# Patient Record
Sex: Female | Born: 1975 | Race: White | Hispanic: No | Marital: Married | Smoking: Never smoker
Health system: Southern US, Community
[De-identification: ages and names within clinical notes are randomized; demographics above are authoritative.]

## PROBLEM LIST (undated history)

## (undated) DIAGNOSIS — R112 Nausea with vomiting, unspecified: Secondary | ICD-10-CM

## (undated) DIAGNOSIS — F419 Anxiety disorder, unspecified: Secondary | ICD-10-CM

## (undated) HISTORY — PX: RHINOPLASTY: SUR1284

## (undated) HISTORY — PX: SINOSCOPY: SHX187

## (undated) HISTORY — PX: TUBAL LIGATION: SHX77

---

## 2004-12-15 ENCOUNTER — Encounter: Admission: RE | Admit: 2004-12-15 | Discharge: 2004-12-15 | Payer: Self-pay

## 2005-03-01 ENCOUNTER — Encounter: Admission: RE | Admit: 2005-03-01 | Discharge: 2005-03-01 | Payer: Self-pay | Admitting: Family Medicine

## 2007-07-24 ENCOUNTER — Encounter: Admission: RE | Admit: 2007-07-24 | Discharge: 2007-07-24 | Payer: Self-pay

## 2008-11-08 ENCOUNTER — Encounter: Admission: RE | Admit: 2008-11-08 | Discharge: 2008-11-08 | Payer: Self-pay | Admitting: Obstetrics & Gynecology

## 2009-07-25 ENCOUNTER — Encounter: Admission: RE | Admit: 2009-07-25 | Discharge: 2009-07-25 | Payer: Self-pay

## 2009-10-07 ENCOUNTER — Encounter: Admission: RE | Admit: 2009-10-07 | Discharge: 2009-10-07 | Payer: Self-pay | Admitting: Neurology

## 2009-12-14 ENCOUNTER — Encounter: Admission: RE | Admit: 2009-12-14 | Discharge: 2009-12-14 | Payer: Self-pay | Admitting: Obstetrics and Gynecology

## 2010-03-14 ENCOUNTER — Encounter: Admission: RE | Admit: 2010-03-14 | Discharge: 2010-03-14 | Payer: Self-pay | Admitting: Otolaryngology

## 2010-08-31 ENCOUNTER — Encounter: Admission: RE | Admit: 2010-08-31 | Discharge: 2010-08-31 | Payer: Self-pay | Admitting: Family Medicine

## 2010-10-16 ENCOUNTER — Encounter
Admission: RE | Admit: 2010-10-16 | Discharge: 2010-10-16 | Payer: Self-pay | Source: Home / Self Care | Attending: Otolaryngology | Admitting: Otolaryngology

## 2010-11-30 ENCOUNTER — Other Ambulatory Visit: Payer: Self-pay | Admitting: Obstetrics and Gynecology

## 2010-11-30 DIAGNOSIS — N6310 Unspecified lump in the right breast, unspecified quadrant: Secondary | ICD-10-CM

## 2010-12-12 ENCOUNTER — Ambulatory Visit
Admission: RE | Admit: 2010-12-12 | Discharge: 2010-12-12 | Disposition: A | Discharge status: Home / Self Care | Payer: No Typology Code available for payment source | Source: Ambulatory Visit | Attending: Obstetrics and Gynecology | Admitting: Obstetrics and Gynecology

## 2010-12-12 ENCOUNTER — Other Ambulatory Visit: Payer: Self-pay | Admitting: Obstetrics and Gynecology

## 2010-12-12 DIAGNOSIS — N6310 Unspecified lump in the right breast, unspecified quadrant: Secondary | ICD-10-CM

## 2012-04-09 ENCOUNTER — Ambulatory Visit
Admission: RE | Admit: 2012-04-09 | Discharge: 2012-04-09 | Disposition: A | Discharge status: Home / Self Care | Payer: PRIVATE HEALTH INSURANCE | Source: Ambulatory Visit | Attending: Otolaryngology | Admitting: Otolaryngology

## 2012-04-09 ENCOUNTER — Other Ambulatory Visit: Payer: Self-pay | Admitting: Otolaryngology

## 2012-04-09 DIAGNOSIS — R1310 Dysphagia, unspecified: Secondary | ICD-10-CM

## 2012-06-03 ENCOUNTER — Other Ambulatory Visit: Payer: Self-pay | Admitting: Otolaryngology

## 2012-06-03 ENCOUNTER — Ambulatory Visit
Admission: RE | Admit: 2012-06-03 | Discharge: 2012-06-03 | Disposition: A | Discharge status: Home / Self Care | Payer: PRIVATE HEALTH INSURANCE | Source: Ambulatory Visit | Attending: Otolaryngology | Admitting: Otolaryngology

## 2012-06-03 DIAGNOSIS — J329 Chronic sinusitis, unspecified: Secondary | ICD-10-CM

## 2013-08-18 ENCOUNTER — Other Ambulatory Visit: Payer: Self-pay | Admitting: Family Medicine

## 2013-08-18 ENCOUNTER — Ambulatory Visit
Admission: RE | Admit: 2013-08-18 | Discharge: 2013-08-18 | Disposition: A | Discharge status: Home / Self Care | Payer: PRIVATE HEALTH INSURANCE | Source: Ambulatory Visit | Attending: Family Medicine | Admitting: Family Medicine

## 2013-08-18 DIAGNOSIS — G8929 Other chronic pain: Secondary | ICD-10-CM

## 2013-08-18 DIAGNOSIS — M542 Cervicalgia: Secondary | ICD-10-CM

## 2013-08-28 ENCOUNTER — Other Ambulatory Visit: Payer: Self-pay | Admitting: Family Medicine

## 2013-08-28 DIAGNOSIS — M542 Cervicalgia: Secondary | ICD-10-CM

## 2013-09-09 ENCOUNTER — Ambulatory Visit
Admission: RE | Admit: 2013-09-09 | Discharge: 2013-09-09 | Disposition: A | Discharge status: Home / Self Care | Payer: PRIVATE HEALTH INSURANCE | Source: Ambulatory Visit | Attending: Family Medicine | Admitting: Family Medicine

## 2013-09-09 DIAGNOSIS — M542 Cervicalgia: Secondary | ICD-10-CM

## 2014-01-18 ENCOUNTER — Other Ambulatory Visit: Payer: Self-pay | Admitting: Family Medicine

## 2014-01-18 ENCOUNTER — Ambulatory Visit
Admission: RE | Admit: 2014-01-18 | Discharge: 2014-01-18 | Disposition: A | Discharge status: Home / Self Care | Payer: PRIVATE HEALTH INSURANCE | Source: Ambulatory Visit | Attending: Family Medicine | Admitting: Family Medicine

## 2014-01-18 DIAGNOSIS — M25562 Pain in left knee: Principal | ICD-10-CM

## 2014-01-18 DIAGNOSIS — M25561 Pain in right knee: Secondary | ICD-10-CM

## 2014-03-15 ENCOUNTER — Other Ambulatory Visit: Payer: Self-pay | Admitting: Otolaryngology

## 2014-03-15 ENCOUNTER — Ambulatory Visit
Admission: RE | Admit: 2014-03-15 | Discharge: 2014-03-15 | Disposition: A | Discharge status: Home / Self Care | Payer: PRIVATE HEALTH INSURANCE | Source: Ambulatory Visit | Attending: Otolaryngology | Admitting: Otolaryngology

## 2014-03-15 DIAGNOSIS — J320 Chronic maxillary sinusitis: Secondary | ICD-10-CM

## 2014-03-30 ENCOUNTER — Other Ambulatory Visit: Payer: Self-pay | Admitting: Otolaryngology

## 2014-03-30 ENCOUNTER — Ambulatory Visit
Admission: RE | Admit: 2014-03-30 | Discharge: 2014-03-30 | Disposition: A | Discharge status: Home / Self Care | Payer: PRIVATE HEALTH INSURANCE | Source: Ambulatory Visit | Attending: Otolaryngology | Admitting: Otolaryngology

## 2014-03-30 DIAGNOSIS — R05 Cough: Secondary | ICD-10-CM

## 2014-03-30 DIAGNOSIS — R059 Cough, unspecified: Secondary | ICD-10-CM

## 2014-04-05 ENCOUNTER — Ambulatory Visit
Admission: RE | Admit: 2014-04-05 | Discharge: 2014-04-05 | Disposition: A | Discharge status: Home / Self Care | Payer: PRIVATE HEALTH INSURANCE | Source: Ambulatory Visit | Attending: Otolaryngology | Admitting: Otolaryngology

## 2014-04-05 ENCOUNTER — Other Ambulatory Visit: Payer: Self-pay | Admitting: Otolaryngology

## 2014-04-05 DIAGNOSIS — J320 Chronic maxillary sinusitis: Secondary | ICD-10-CM

## 2015-07-27 ENCOUNTER — Ambulatory Visit (INDEPENDENT_AMBULATORY_CARE_PROVIDER_SITE_OTHER): Payer: PRIVATE HEALTH INSURANCE | Admitting: *Deleted

## 2015-07-27 DIAGNOSIS — J309 Allergic rhinitis, unspecified: Secondary | ICD-10-CM

## 2015-08-04 ENCOUNTER — Ambulatory Visit (INDEPENDENT_AMBULATORY_CARE_PROVIDER_SITE_OTHER): Payer: PRIVATE HEALTH INSURANCE | Admitting: Neurology

## 2015-08-04 DIAGNOSIS — J309 Allergic rhinitis, unspecified: Secondary | ICD-10-CM

## 2015-08-09 ENCOUNTER — Encounter: Payer: Self-pay | Admitting: Allergy and Immunology

## 2015-08-09 ENCOUNTER — Ambulatory Visit (INDEPENDENT_AMBULATORY_CARE_PROVIDER_SITE_OTHER): Payer: PRIVATE HEALTH INSURANCE | Admitting: Allergy and Immunology

## 2015-08-09 VITALS — BP 100/70 | HR 60 | Temp 98.0°F | Resp 20

## 2015-08-09 DIAGNOSIS — J3089 Other allergic rhinitis: Secondary | ICD-10-CM | POA: Diagnosis not present

## 2015-08-09 NOTE — Patient Instructions (Signed)
Other allergic rhinitis  Hold immunotherapy dose at 0.20 mL of the Blue vial for the pollens and dust mite and mold immunotherapy dose at her 0.15 mL of the Gold vial for ragweed, cat, mold, and cockroach antigen.   If she has no large local reactions for a prolonged period of time, we will attempt to slowly build up to higher dosages.  When immunotherapy vials are almost empty, we will re-mix into 3 separate vials, rather than 2 vials in an attempt to reduce large local reactions and possibly move to a higher dosages. Vial #1 will contain the following antigens: Grass, cat, and dust mite. Vial #2 will contain the following antigens: Weed pollen, ragweed pollen, and tree pollen. Vial #3 will contain the following antigens: Mold and cockroach.  Continue cetirizine, azelastine, and/or Pataday as needed.   Return in about 4 months (around 12/10/2015), or if symptoms worsen or fail to improve.

## 2015-08-09 NOTE — Progress Notes (Signed)
  History of present illness: HPI Comments: This 39 year old female with allergic rhinoconjunctivitis on immunotherapy presents today for follow up.  She reports that she has noticed improvement in her nasal/sinus symptoms while on immunotherapy buildup.  However, she has been unable to reach maintenance dose due to large local reactions.    Assessment and plan: Other allergic rhinitis  Hold immunotherapy dose at 0.20 mL of the Blue vial for the pollens and dust mite and mold immunotherapy dose at her 0.15 mL of the Gold vial for ragweed, cat, mold, and cockroach antigen.   If she has no large local reactions for a prolonged period of time, we will attempt to slowly build up to higher dosages.  When immunotherapy vials are almost empty, we will re-mix into 3 separate vials, rather than 2 vials in an attempt to reduce large local reactions and possibly move to a higher dosages. Vial #1 will contain the following antigens: Grass, cat, and dust mite. Vial #2 will contain the following antigens: Weed pollen, ragweed pollen, and tree pollen. Vial #3 will contain the following antigens: Mold and cockroach.  Continue cetirizine, azelastine, and/or Pataday as needed.   Diagnositics: none performed     Physical examination: Blood pressure 100/70, pulse 60, temperature 98 F (36.7 C), temperature source Oral, resp. rate 20.  General: Alert, interactive, in no acute distress. HEENT: TMs pearly gray, turbinates minimally edematous without discharge, post-pharynx non erythematous. Neck: Supple without lymphadenopathy. Lungs: Clear to auscultation without wheezing, rhonchi or rales. CV: Normal S1, S2 without murmurs. Skin: Warm and dry, without lesions or rashes.  The following portions of the patient's history were reviewed and updated as appropriate: allergies, current medications, past family history, past medical history, past social history, past surgical history and problem  list.  Outpatient medications:   Medication List       This list is accurate as of: 08/09/15  5:01 PM.  Always use your most recent med list.               azelastine 0.1 % nasal spray  Commonly known as:  ASTELIN  Place 1 spray into both nostrils 2 (two) times daily. Use in each nostril as directed     cetirizine 10 MG tablet  Commonly known as:  ZYRTEC  Take 10 mg by mouth daily.     EPINEPHrine 0.3 mg/0.3 mL Soaj injection  Commonly known as:  EPI-PEN  Inject 0.3 mg into the muscle once as needed (for a severe allergic reaction).     multivitamin tablet  Take 1 tablet by mouth daily.     NORETHIN-ETH ESTRADIOL-FE PO  Take by mouth.     norethindrone 0.35 MG tablet  Commonly known as:  MICRONOR,CAMILA,ERRIN  Take 1 tablet by mouth daily.     PATADAY 0.2 % Soln  Generic drug:  Olopatadine HCl  Apply to eye once.     sertraline 50 MG tablet  Commonly known as:  ZOLOFT  Take 50 mg by mouth daily.        Known medication allergies: Allergies  Allergen Reactions  . Vancomycin Hives  . Latex Itching  . Monistat [Miconazole] Swelling  . Terazol [Terconazole] Swelling    I appreciate the opportunity to take part in this Dailah's care. Please do not hesitate to contact me with questions.  Sincerely,   R. Jorene Guest, MD

## 2015-08-09 NOTE — Assessment & Plan Note (Addendum)
   Hold immunotherapy dose at 0.20 mL of the Blue vial for the pollens and dust mite and mold immunotherapy dose at her 0.15 mL of the Gold vial for ragweed, cat, mold, and cockroach antigen.   If she has no large local reactions for a prolonged period of time, we will attempt to slowly build up to higher dosages.  When immunotherapy vials are almost empty, we will re-mix into 3 separate vials, rather than 2 vials in an attempt to reduce large local reactions and possibly move to a higher dosages. Vial #1 will contain the following antigens: Grass, cat, and dust mite. Vial #2 will contain the following antigens: Weed pollen, ragweed pollen, and tree pollen. Vial #3 will contain the following antigens: Mold and cockroach.  Continue cetirizine, azelastine, and/or Pataday as needed.

## 2015-08-10 ENCOUNTER — Ambulatory Visit: Payer: Self-pay | Admitting: Allergy and Immunology

## 2015-08-11 ENCOUNTER — Ambulatory Visit (INDEPENDENT_AMBULATORY_CARE_PROVIDER_SITE_OTHER): Payer: PRIVATE HEALTH INSURANCE

## 2015-08-11 DIAGNOSIS — J309 Allergic rhinitis, unspecified: Secondary | ICD-10-CM

## 2015-08-18 ENCOUNTER — Ambulatory Visit (INDEPENDENT_AMBULATORY_CARE_PROVIDER_SITE_OTHER): Payer: PRIVATE HEALTH INSURANCE | Admitting: Neurology

## 2015-08-18 DIAGNOSIS — J309 Allergic rhinitis, unspecified: Secondary | ICD-10-CM | POA: Diagnosis not present

## 2015-08-25 ENCOUNTER — Ambulatory Visit (INDEPENDENT_AMBULATORY_CARE_PROVIDER_SITE_OTHER): Payer: PRIVATE HEALTH INSURANCE

## 2015-08-25 DIAGNOSIS — J309 Allergic rhinitis, unspecified: Secondary | ICD-10-CM

## 2015-09-08 ENCOUNTER — Ambulatory Visit (INDEPENDENT_AMBULATORY_CARE_PROVIDER_SITE_OTHER): Payer: PRIVATE HEALTH INSURANCE

## 2015-09-08 DIAGNOSIS — J301 Allergic rhinitis due to pollen: Secondary | ICD-10-CM | POA: Diagnosis not present

## 2015-09-21 ENCOUNTER — Ambulatory Visit (INDEPENDENT_AMBULATORY_CARE_PROVIDER_SITE_OTHER): Payer: PRIVATE HEALTH INSURANCE | Admitting: Neurology

## 2015-09-21 DIAGNOSIS — J309 Allergic rhinitis, unspecified: Secondary | ICD-10-CM | POA: Diagnosis not present

## 2015-10-04 ENCOUNTER — Ambulatory Visit (INDEPENDENT_AMBULATORY_CARE_PROVIDER_SITE_OTHER): Payer: PRIVATE HEALTH INSURANCE

## 2015-10-04 DIAGNOSIS — J309 Allergic rhinitis, unspecified: Secondary | ICD-10-CM

## 2015-10-18 ENCOUNTER — Ambulatory Visit (INDEPENDENT_AMBULATORY_CARE_PROVIDER_SITE_OTHER): Payer: PRIVATE HEALTH INSURANCE

## 2015-10-18 DIAGNOSIS — J309 Allergic rhinitis, unspecified: Secondary | ICD-10-CM

## 2015-11-08 ENCOUNTER — Ambulatory Visit (INDEPENDENT_AMBULATORY_CARE_PROVIDER_SITE_OTHER): Payer: PRIVATE HEALTH INSURANCE | Admitting: *Deleted

## 2015-11-08 DIAGNOSIS — J309 Allergic rhinitis, unspecified: Secondary | ICD-10-CM

## 2015-11-15 ENCOUNTER — Other Ambulatory Visit: Payer: PRIVATE HEALTH INSURANCE

## 2015-11-15 ENCOUNTER — Other Ambulatory Visit: Payer: Self-pay | Admitting: Otolaryngology

## 2015-11-15 DIAGNOSIS — J320 Chronic maxillary sinusitis: Secondary | ICD-10-CM

## 2015-11-17 ENCOUNTER — Ambulatory Visit
Admission: RE | Admit: 2015-11-17 | Discharge: 2015-11-17 | Disposition: A | Discharge status: Home / Self Care | Payer: PRIVATE HEALTH INSURANCE | Source: Ambulatory Visit | Attending: Otolaryngology | Admitting: Otolaryngology

## 2015-11-17 DIAGNOSIS — J320 Chronic maxillary sinusitis: Secondary | ICD-10-CM

## 2015-11-22 ENCOUNTER — Other Ambulatory Visit: Payer: Self-pay

## 2015-11-22 ENCOUNTER — Ambulatory Visit (INDEPENDENT_AMBULATORY_CARE_PROVIDER_SITE_OTHER): Payer: PRIVATE HEALTH INSURANCE

## 2015-11-22 DIAGNOSIS — J309 Allergic rhinitis, unspecified: Secondary | ICD-10-CM | POA: Diagnosis not present

## 2015-12-06 ENCOUNTER — Ambulatory Visit (INDEPENDENT_AMBULATORY_CARE_PROVIDER_SITE_OTHER): Payer: PRIVATE HEALTH INSURANCE

## 2015-12-06 DIAGNOSIS — J309 Allergic rhinitis, unspecified: Secondary | ICD-10-CM | POA: Diagnosis not present

## 2015-12-14 ENCOUNTER — Ambulatory Visit: Payer: PRIVATE HEALTH INSURANCE | Admitting: Allergy and Immunology

## 2015-12-21 ENCOUNTER — Encounter: Payer: Self-pay | Admitting: Allergy and Immunology

## 2015-12-21 ENCOUNTER — Ambulatory Visit (INDEPENDENT_AMBULATORY_CARE_PROVIDER_SITE_OTHER): Payer: PRIVATE HEALTH INSURANCE | Admitting: Allergy and Immunology

## 2015-12-21 ENCOUNTER — Ambulatory Visit (INDEPENDENT_AMBULATORY_CARE_PROVIDER_SITE_OTHER): Payer: PRIVATE HEALTH INSURANCE

## 2015-12-21 VITALS — BP 112/64 | HR 64 | Resp 18

## 2015-12-21 DIAGNOSIS — L500 Allergic urticaria: Secondary | ICD-10-CM | POA: Diagnosis not present

## 2015-12-21 DIAGNOSIS — J3089 Other allergic rhinitis: Secondary | ICD-10-CM | POA: Diagnosis not present

## 2015-12-21 DIAGNOSIS — J309 Allergic rhinitis, unspecified: Secondary | ICD-10-CM

## 2015-12-21 NOTE — Progress Notes (Addendum)
Follow-up Note  RE: MARLON VONRUDEN MRN: [id number] DOB: 25-Jan-1976 Date of Office Visit: 12/21/2015  Primary care provider: Retia Passe, MD Referring provider: Retia Passe, MD  History of present illness: HPI Comments: Breanna Fuller is a 40 y.o. female with allergic rhinitis on immunotherapy who presents today for follow up and a new problem.  Over the past 2 months, Tykera has experienced recurrent episodes of hives. Typical distribution includes the neck, back, arms and legs.  The lesions are described as erythematous, raised, and pruritic.  Individual hives last less than 24 hours without leaving residual pigmentation or bruising. She does not experience concomitant cardiopulmonary or GI symptoms. She has not experienced unexpected weight loss, recurrent fevers or drenching night sweats. No specific medication, food or environmental triggers have been identified. The symptoms seem to","do not seem to correlate with NSAIDs use. Symptoms seem to correlate with emotional stress. She had an upper respiratory tract infection at the time of symptom onset. Katielynn has tried to control symptoms with OTC antihistamines which have offered modest temporary relief of symptoms. She has not been evaluated and treated in the emergency department for these symptoms. Skin biopsy has not been performed. While on aeroallergen immunotherapy she has noticed symptom reduction and decrease number of sinus infections over this past year compared with the previous 2 or 3 years.  She has not developed large local reaction at the injection site from immunotherapy since her buildup does had been held.    Assessment and plan: Urticaria Often times the onset of urticaria population is secondary to viral infection. Once the mast cell membranes have been destabilized, it is not unusual for recurrent episodes of histamine release to occur in the ensuing weeks or months.  Skin tests to select food allergens were negative  today. NSAIDs commonly exacerbate urticaria but are not the underlying etiology in this case. Physical urticarias are negative by history (i.e. pressure-induced, temperature, vibration, solar, etc.).  There are no concomitant symptoms concerning for anaphylaxis or constitutional symptoms worrisome for an underlying malignancy. We will not order labs at this time, however, if lesions recur, persist, progress, or change in character, we will assess other potential etiologies with screening labs.  For symptom relief, the patient is to take oral antihistamines as directed.  Instructions have been provided and discussed for H1/H2 receptor blockade with step-wise increase/decrease to find lowest effective dose.  For significant symptoms, a journal is to be kept recording any foods eaten, beverages consumed, medications taken within a 6 hour period prior to the onset of symptoms, as well as record activities being performed, and environmental conditions. For any symptoms concerning for anaphylaxis, 911 is to be called immediately.  Other allergic rhinitis  Resume aeroallergen immunotherapy buildup in 0.05 mL increments. Nadia is to let us know if she experiences a large local reaction.  Continue appropriate allergen avoidance measures and as needed medications.    Physical examination: Blood pressure 112/64, pulse 64, resp. rate 18.  General: Alert, interactive, in no acute distress. HEENT: TMs pearly gray, turbinates mildly edematous without discharge, post-pharynx mildly erythematous. Neck: Supple without lymphadenopathy. Lungs: Clear to auscultation without wheezing, rhonchi or rales. CV: Normal S1, S2 without murmurs. Skin: Erythematous papules and small urticarial lesions on the wrists and forearms.  The following portions of the patient's history were reviewed and updated as appropriate: allergies, current medications, past family history, past medical history, past social history, past  surgical history and problem list.    Medication List  This list is accurate as of: 12/21/15  6:10 PM.  Always use your most recent med list.               azelastine 0.1 % nasal spray  Commonly known as:  ASTELIN  Place 1 spray into both nostrils 2 (two) times daily. Use in each nostril as directed     cetirizine 10 MG tablet  Commonly known as:  ZYRTEC  Take 10 mg by mouth daily.     EPINEPHrine 0.3 mg/0.3 mL Soaj injection  Commonly known as:  EPI-PEN  Inject 0.3 mg into the muscle once as needed (for a severe allergic reaction).     multivitamin tablet  Take 1 tablet by mouth daily.     NORETHIN-ETH ESTRADIOL-FE PO  Take by mouth.     norethindrone 0.35 MG tablet  Commonly known as:  MICRONOR,CAMILA,ERRIN  Take 1 tablet by mouth daily. Reported on 12/21/2015     PATADAY 0.2 % Soln  Generic drug:  Olopatadine HCl  Apply to eye once.     sertraline 50 MG tablet  Commonly known as:  ZOLOFT  Take 50 mg by mouth daily.        Allergies  Allergen Reactions  . Vancomycin Hives  . Latex Itching  . Monistat [Miconazole] Swelling  . Terazol [Terconazole] Swelling    I appreciate the opportunity to take part in this Ogechi's care. Please do not hesitate to contact me with questions.  Sincerely,   R. Jorene Guest, MD

## 2015-12-21 NOTE — Assessment & Plan Note (Signed)
   Resume aeroallergen immunotherapy buildup in 0.05 mL increments. Breanna Fuller is to let us know if she experiences a large local reaction.  Continue appropriate allergen avoidance measures and as needed medications.

## 2015-12-21 NOTE — Patient Instructions (Addendum)
Urticaria Often times the onset of urticaria population is secondary to viral infection. Once the mast cell membranes have been destabilized, it is not unusual for recurrent episodes of histamine release to occur in the ensuing weeks or months.  Skin tests to select food allergens were negative today. NSAIDs commonly exacerbate urticaria but are not the underlying etiology in this case. Physical urticarias are negative by history (i.e. pressure-induced, temperature, vibration, solar, etc.).  There are no concomitant symptoms concerning for anaphylaxis or constitutional symptoms worrisome for an underlying malignancy. We will not order labs at this time, however, if lesions recur, persist, progress, or change in character, we will assess other potential etiologies with screening labs.  For symptom relief, the patient is to take oral antihistamines as directed.  Instructions have been provided and discussed for H1/H2 receptor blockade with step-wise increase/decrease to find lowest effective dose.  For significant symptoms, a journal is to be kept recording any foods eaten, beverages consumed, medications taken within a 6 hour period prior to the onset of symptoms, as well as record activities being performed, and environmental conditions. For any symptoms concerning for anaphylaxis, 911 is to be called immediately.  Other allergic rhinitis  Resume aeroallergen immunotherapy buildup in 0.05 mL increments. Rosezetta is to let us know if she experiences a large local reaction.  Continue appropriate allergen avoidance measures and as needed medications.    Return in about 6 weeks (around 02/01/2016), or if symptoms worsen or fail to improve.  Hives (urticaria)  . Cetirizine (Zyrtec)  twice a day and ranitidine (Zantac) 150 mg twice a day. If no symptoms for 7-14 days then decrease to. . Cetirizine (Zyrtec)  twice a day and ranitidine (Zantac) 150 mg once a day.  If no symptoms for 7-14 days then  decrease to. . Cetirizine (Zyrtec)  twice a day.  If no symptoms for 7-14 days then decrease to. . Cetirizine (Zyrtec)  once a day.  May use Benadryl (diphenhydramine) as needed for breakthrough hives       If symptoms return, then step up dosage

## 2015-12-21 NOTE — Assessment & Plan Note (Signed)
Often times the onset of urticaria population is secondary to viral infection. Once the mast cell membranes have been destabilized, it is not unusual for recurrent episodes of histamine release to occur in the ensuing weeks or months.  Skin tests to select food allergens were negative today. NSAIDs commonly exacerbate urticaria but are not the underlying etiology in this case. Physical urticarias are negative by history (i.e. pressure-induced, temperature, vibration, solar, etc.).  There are no concomitant symptoms concerning for anaphylaxis or constitutional symptoms worrisome for an underlying malignancy. We will not order labs at this time, however, if lesions recur, persist, progress, or change in character, we will assess other potential etiologies with screening labs.  For symptom relief, the patient is to take oral antihistamines as directed.  Instructions have been provided and discussed for H1/H2 receptor blockade with step-wise increase/decrease to find lowest effective dose.  For significant symptoms, a journal is to be kept recording any foods eaten, beverages consumed, medications taken within a 6 hour period prior to the onset of symptoms, as well as record activities being performed, and environmental conditions. For any symptoms concerning for anaphylaxis, 911 is to be called immediately.

## 2015-12-30 ENCOUNTER — Telehealth: Payer: Self-pay

## 2015-12-30 NOTE — Telephone Encounter (Signed)
Dr. Nunzio CobbsBobbitt patient stated that her hives are not going away and that she is taking her zyrtec and zantac twice a day as directed.  She wanted to know if it is something else that she could try.  She also mentioned have lab work or being referred to a dermatologist. Please Advise.

## 2015-12-30 NOTE — Telephone Encounter (Signed)
Spoke to patient and informed her to continue to take the medication as directed until we here for Dr. Nunzio CobbsBobbitt.

## 2015-12-30 NOTE — Telephone Encounter (Signed)
Patient was last seen on 12/21/2015 by Dr. Nunzio CobbsBobbitt. She has been taking the zyrtec and zantac 2x a day for a week now and things are not better. The rash/hives are not going away.  Patient is wondering what else could we call in or could she try.   [facility] [place]

## 2016-01-02 ENCOUNTER — Ambulatory Visit (INDEPENDENT_AMBULATORY_CARE_PROVIDER_SITE_OTHER): Payer: PRIVATE HEALTH INSURANCE

## 2016-01-02 ENCOUNTER — Other Ambulatory Visit: Payer: Self-pay | Admitting: Allergy and Immunology

## 2016-01-02 ENCOUNTER — Other Ambulatory Visit: Payer: Self-pay

## 2016-01-02 DIAGNOSIS — J309 Allergic rhinitis, unspecified: Secondary | ICD-10-CM | POA: Diagnosis not present

## 2016-01-02 DIAGNOSIS — K2970 Gastritis, unspecified, without bleeding: Secondary | ICD-10-CM

## 2016-01-02 DIAGNOSIS — L500 Allergic urticaria: Secondary | ICD-10-CM

## 2016-01-02 DIAGNOSIS — J3089 Other allergic rhinitis: Secondary | ICD-10-CM

## 2016-01-02 LAB — CBC WITH DIFFERENTIAL/PLATELET
Basophils Absolute: 0 10*3/uL (ref 0.0–0.1)
Basophils Relative: 0 % (ref 0–1)
Eosinophils Absolute: 0.4 10*3/uL (ref 0.0–0.7)
Eosinophils Relative: 5 % (ref 0–5)
HCT: 41.7 % (ref 36.0–46.0)
Hemoglobin: 14 g/dL (ref 12.0–15.0)
Lymphocytes Relative: 33 % (ref 12–46)
Lymphs Abs: 2.4 10*3/uL (ref 0.7–4.0)
MCH: 30 pg (ref 26.0–34.0)
MCHC: 33.6 g/dL (ref 30.0–36.0)
MCV: 89.3 fL (ref 78.0–100.0)
MPV: 9.6 fL (ref 8.6–12.4)
Monocytes Absolute: 0.7 10*3/uL (ref 0.1–1.0)
Monocytes Relative: 9 % (ref 3–12)
Neutro Abs: 3.9 10*3/uL (ref 1.7–7.7)
Neutrophils Relative %: 53 % (ref 43–77)
Platelets: 283 10*3/uL (ref 150–400)
RBC: 4.67 MIL/uL (ref 3.87–5.11)
RDW: 12.9 % (ref 11.5–15.5)
WBC: 7.4 10*3/uL (ref 4.0–10.5)

## 2016-01-02 MED ORDER — LEVOCETIRIZINE DIHYDROCHLORIDE 5 MG PO TABS: 5.0000 mg | ORAL_TABLET | Freq: Every evening | ORAL | Status: DC

## 2016-01-02 NOTE — Telephone Encounter (Signed)
Spoke to patient and informed her that we have called in a script for her and that the lab forms will be at the front desk.  She stated that she will get them this afternoon.

## 2016-01-02 NOTE — Telephone Encounter (Signed)
Patient received lab forms

## 2016-01-02 NOTE — Telephone Encounter (Signed)
Please call in levocetirizine 5 mg daily as needed to be used instead of the morning cetirizine dose for urticaria (see below). Also she will need to have the following labs drawn: CU index panel, tryptase, urea breath test, CBC, CMP, ESR, ANA, and galactose-alpha-1,3-galactose IgE level. Please let her know that when lab results have returned she will be called with further recommendations and follow up instructions.   Urticaria (Hives)  . Levocetirizine (Xyzal) 5 mg in morning and Cetirizine (Zyrtec) 75m at night and ranitidine (Zantac) 150 mg twice a day. If no symptoms for 7-14 days then decrease to. . Levocetirizine (Xyzal) 5 mg in morning and Cetirizine (Zyrtec) 144mat night and ranitidine (Zantac) 150 mg once a day.  If no symptoms for 7-14 days then decrease to. . Levocetirizine (Xyzal) 5 mg in morning and Cetirizine (Zyrtec) 1074mt night.  If no symptoms for 7-14 days then decrease to. . Levocetirizine (Xyzal) 5 mg once a day.  May use Benadryl (diphenhydramine) as needed for breakthrough symptoms       If symptoms return, then step up dosage

## 2016-01-03 LAB — SEDIMENTATION RATE: Sed Rate: 1 mm/hr (ref 0–20)

## 2016-01-03 LAB — COMPREHENSIVE METABOLIC PANEL
ALT: 10 U/L (ref 6–29)
AST: 15 U/L (ref 10–30)
Albumin: 4.3 g/dL (ref 3.6–5.1)
Alkaline Phosphatase: 40 U/L (ref 33–115)
BUN: 18 mg/dL (ref 7–25)
CO2: 31 mmol/L (ref 20–31)
Calcium: 9.7 mg/dL (ref 8.6–10.2)
Chloride: 102 mmol/L (ref 98–110)
Creat: 0.82 mg/dL (ref 0.50–1.10)
Glucose, Bld: 86 mg/dL (ref 65–99)
Potassium: 4 mmol/L (ref 3.5–5.3)
Sodium: 139 mmol/L (ref 135–146)
Total Bilirubin: 0.3 mg/dL (ref 0.2–1.2)
Total Protein: 7 g/dL (ref 6.1–8.1)

## 2016-01-03 LAB — TRYPTASE: Tryptase: 3.7 ug/L (ref <11)

## 2016-01-03 LAB — H. PYLORI BREATH TEST: H. pylori Breath Test: NOT DETECTED

## 2016-01-04 LAB — ANA: Anti Nuclear Antibody(ANA): POSITIVE — AB

## 2016-01-04 LAB — ANTI-NUCLEAR AB-TITER (ANA TITER): ANA Titer 1: 1:160 {titer} — ABNORMAL HIGH

## 2016-01-04 LAB — GALACTOSE-ALPHA-1,3-GALACTOSE IGE: Galactose-alpha-1,3-galactose IgE: <0.1 kU/L (ref <0.35)

## 2016-01-09 LAB — CP CHRONIC URTICARIA INDEX PANEL
Histamine Release: <16 % (ref <16)
TSH: 0.86 mIU/L
Thyroglobulin Ab: <1 IU/mL (ref <2)
Thyroperoxidase Ab SerPl-aCnc: 1 IU/mL (ref <9)

## 2016-01-17 ENCOUNTER — Telehealth: Payer: Self-pay

## 2016-01-17 NOTE — Telephone Encounter (Signed)
I called, no answer, left a message for her to call back when she gets the hives.

## 2016-01-17 NOTE — Telephone Encounter (Signed)
Patient is calling to check the status of her lab results. Patient went on 01/02/16, she is a patient of Dr. Nunzio CobbsBobbitt  Please Advise  Thanks

## 2016-01-18 ENCOUNTER — Ambulatory Visit (INDEPENDENT_AMBULATORY_CARE_PROVIDER_SITE_OTHER): Payer: PRIVATE HEALTH INSURANCE

## 2016-01-18 DIAGNOSIS — J309 Allergic rhinitis, unspecified: Secondary | ICD-10-CM | POA: Diagnosis not present

## 2016-01-18 MED ORDER — MONTELUKAST SODIUM 10 MG PO TABS: 10.0000 mg | ORAL_TABLET | Freq: Every day | ORAL | Status: DC

## 2016-01-18 NOTE — Telephone Encounter (Signed)
Per Dr Nunzio CobbsBobbitt send in Montelukast 10 mg. Sent to pharmacy

## 2016-01-26 DIAGNOSIS — J301 Allergic rhinitis due to pollen: Secondary | ICD-10-CM | POA: Diagnosis not present

## 2016-01-27 DIAGNOSIS — J3089 Other allergic rhinitis: Secondary | ICD-10-CM | POA: Diagnosis not present

## 2016-01-30 ENCOUNTER — Telehealth: Payer: Self-pay | Admitting: Allergy and Immunology

## 2016-01-30 ENCOUNTER — Telehealth: Payer: Self-pay

## 2016-01-30 MED ORDER — PREDNISONE 10 MG PO TABS: ORAL_TABLET | ORAL | Status: DC

## 2016-01-30 MED ORDER — DOXEPIN HCL 25 MG PO CAPS: 25.0000 mg | ORAL_CAPSULE | Freq: Every day | ORAL | Status: DC

## 2016-01-30 NOTE — Telephone Encounter (Signed)
Add doxepin 25 mg qhs and give her prednisone 20mg x4 days and 10mg  x 3 days. Thanks.

## 2016-01-30 NOTE — Telephone Encounter (Signed)
Patient called.  Sent in prescriptions as instructed by Dr. Nunzio CobbsBobbitt to:  [phone] [place], [place] - [address]   Left voice mail with instructions from Dr. Nunzio CobbsBobbitt and instructed to call office if any questions

## 2016-01-30 NOTE — Telephone Encounter (Signed)
Pt called and still has her hives and would like for you to call in prednisone for her. [facility] [place]. Pt number is [phone].

## 2016-01-30 NOTE — Telephone Encounter (Signed)
Patient would like to know if she can still take her allergy shot tomorrow after starting prednisone.  She also wanted Dr. Nunzio CobbsBobbitt to know she had a small red knot where she got her allergy injection last time and wasn't sure if he would want to change to dose

## 2016-01-30 NOTE — Telephone Encounter (Signed)
Spoke with patient and she is doing Ranitidine 150 mg twice a day, Levocetirizine 5 mg each morning, Zyrtec 10 mg and Singulair 10 mg each evening, and using Kenalog 0.1% cream twice daily for one week. Patient complains this has been breaking out of and on. She states has cut out all gluten for 2 weeks.  Has wedding to go to this weekend and states her legs look bad.  Patient denies any fever.  Please advise.

## 2016-01-30 NOTE — Telephone Encounter (Signed)
Spoke with patient and she is doing Ranitidine 150 mg twice a day, Levocetirizine 5 mg each morning, Zyrtec 10 mg and Singulair 10 mg each evening, and using Kenalog 0.1% cream twice daily for one week. Patient complains this has been breaking out of and on. She states has cut out all gluten for 2 weeks. Has wedding to go to this weekend and states her legs look bad. Patient denies any fever. Please advise   Routed to Dr. Nunzio CobbsBobbitt

## 2016-01-30 NOTE — Telephone Encounter (Signed)
Yes, she may still receive her allergy injection.

## 2016-01-31 ENCOUNTER — Ambulatory Visit (INDEPENDENT_AMBULATORY_CARE_PROVIDER_SITE_OTHER): Payer: PRIVATE HEALTH INSURANCE

## 2016-01-31 DIAGNOSIS — J309 Allergic rhinitis, unspecified: Secondary | ICD-10-CM | POA: Diagnosis not present

## 2016-01-31 NOTE — Telephone Encounter (Signed)
Left message for patient to call office.  

## 2016-01-31 NOTE — Telephone Encounter (Signed)
Informed patient she may come in for allergy injections today.

## 2016-02-07 ENCOUNTER — Ambulatory Visit (INDEPENDENT_AMBULATORY_CARE_PROVIDER_SITE_OTHER): Payer: PRIVATE HEALTH INSURANCE | Admitting: *Deleted

## 2016-02-07 DIAGNOSIS — J309 Allergic rhinitis, unspecified: Secondary | ICD-10-CM | POA: Diagnosis not present

## 2016-02-14 ENCOUNTER — Ambulatory Visit (INDEPENDENT_AMBULATORY_CARE_PROVIDER_SITE_OTHER): Payer: PRIVATE HEALTH INSURANCE

## 2016-02-14 DIAGNOSIS — J309 Allergic rhinitis, unspecified: Secondary | ICD-10-CM

## 2016-02-28 ENCOUNTER — Ambulatory Visit (INDEPENDENT_AMBULATORY_CARE_PROVIDER_SITE_OTHER): Payer: PRIVATE HEALTH INSURANCE

## 2016-02-28 DIAGNOSIS — J309 Allergic rhinitis, unspecified: Secondary | ICD-10-CM

## 2016-03-06 ENCOUNTER — Ambulatory Visit (INDEPENDENT_AMBULATORY_CARE_PROVIDER_SITE_OTHER): Payer: PRIVATE HEALTH INSURANCE | Admitting: *Deleted

## 2016-03-06 DIAGNOSIS — J309 Allergic rhinitis, unspecified: Secondary | ICD-10-CM | POA: Diagnosis not present

## 2016-03-19 ENCOUNTER — Ambulatory Visit (INDEPENDENT_AMBULATORY_CARE_PROVIDER_SITE_OTHER): Payer: PRIVATE HEALTH INSURANCE

## 2016-03-19 DIAGNOSIS — J309 Allergic rhinitis, unspecified: Secondary | ICD-10-CM | POA: Diagnosis not present

## 2016-03-27 ENCOUNTER — Ambulatory Visit (INDEPENDENT_AMBULATORY_CARE_PROVIDER_SITE_OTHER): Payer: PRIVATE HEALTH INSURANCE

## 2016-03-27 DIAGNOSIS — J309 Allergic rhinitis, unspecified: Secondary | ICD-10-CM | POA: Diagnosis not present

## 2016-03-28 ENCOUNTER — Telehealth: Payer: Self-pay

## 2016-03-28 NOTE — Telephone Encounter (Signed)
Noted. Drop back 2 doses and resume buildup if no problems.

## 2016-03-28 NOTE — Telephone Encounter (Signed)
Patient informed of plan to drop back 2 doses.  I will document on injection record.

## 2016-03-28 NOTE — Telephone Encounter (Signed)
Patient called---she received ITX yesterday and now she is broken out with hives on her left buttock and left neck.  Hives started yesterday afternoon after her injections.  Better today, but still itchy.  No problems with breathing or any other symptoms.  She is taking her antihistamine and Benadryl as needed.  She just wanted Dr. Nunzio CobbsBobbitt to know.

## 2016-04-03 ENCOUNTER — Ambulatory Visit (INDEPENDENT_AMBULATORY_CARE_PROVIDER_SITE_OTHER): Payer: PRIVATE HEALTH INSURANCE | Admitting: *Deleted

## 2016-04-03 DIAGNOSIS — J309 Allergic rhinitis, unspecified: Secondary | ICD-10-CM

## 2016-04-10 ENCOUNTER — Ambulatory Visit (INDEPENDENT_AMBULATORY_CARE_PROVIDER_SITE_OTHER): Payer: PRIVATE HEALTH INSURANCE | Admitting: *Deleted

## 2016-04-10 DIAGNOSIS — J309 Allergic rhinitis, unspecified: Secondary | ICD-10-CM | POA: Diagnosis not present

## 2016-04-17 ENCOUNTER — Ambulatory Visit (INDEPENDENT_AMBULATORY_CARE_PROVIDER_SITE_OTHER): Payer: PRIVATE HEALTH INSURANCE

## 2016-04-17 DIAGNOSIS — J309 Allergic rhinitis, unspecified: Secondary | ICD-10-CM

## 2016-05-02 ENCOUNTER — Ambulatory Visit (INDEPENDENT_AMBULATORY_CARE_PROVIDER_SITE_OTHER): Payer: PRIVATE HEALTH INSURANCE

## 2016-05-02 DIAGNOSIS — J309 Allergic rhinitis, unspecified: Secondary | ICD-10-CM | POA: Diagnosis not present

## 2016-05-15 ENCOUNTER — Ambulatory Visit (INDEPENDENT_AMBULATORY_CARE_PROVIDER_SITE_OTHER): Payer: PRIVATE HEALTH INSURANCE | Admitting: *Deleted

## 2016-05-15 DIAGNOSIS — J309 Allergic rhinitis, unspecified: Secondary | ICD-10-CM

## 2016-05-29 ENCOUNTER — Ambulatory Visit (INDEPENDENT_AMBULATORY_CARE_PROVIDER_SITE_OTHER): Payer: PRIVATE HEALTH INSURANCE

## 2016-05-29 DIAGNOSIS — J309 Allergic rhinitis, unspecified: Secondary | ICD-10-CM | POA: Diagnosis not present

## 2016-06-12 ENCOUNTER — Ambulatory Visit (INDEPENDENT_AMBULATORY_CARE_PROVIDER_SITE_OTHER): Payer: PRIVATE HEALTH INSURANCE | Admitting: *Deleted

## 2016-06-12 DIAGNOSIS — J309 Allergic rhinitis, unspecified: Secondary | ICD-10-CM

## 2016-06-19 ENCOUNTER — Ambulatory Visit (INDEPENDENT_AMBULATORY_CARE_PROVIDER_SITE_OTHER): Payer: PRIVATE HEALTH INSURANCE | Admitting: *Deleted

## 2016-06-19 DIAGNOSIS — J309 Allergic rhinitis, unspecified: Secondary | ICD-10-CM | POA: Diagnosis not present

## 2016-06-26 ENCOUNTER — Ambulatory Visit (INDEPENDENT_AMBULATORY_CARE_PROVIDER_SITE_OTHER): Payer: PRIVATE HEALTH INSURANCE | Admitting: Allergy and Immunology

## 2016-06-26 ENCOUNTER — Encounter: Payer: Self-pay | Admitting: Allergy and Immunology

## 2016-06-26 VITALS — BP 110/68 | HR 64 | Resp 16

## 2016-06-26 DIAGNOSIS — R198 Other specified symptoms and signs involving the digestive system and abdomen: Secondary | ICD-10-CM | POA: Diagnosis not present

## 2016-06-26 DIAGNOSIS — J3089 Other allergic rhinitis: Secondary | ICD-10-CM | POA: Diagnosis not present

## 2016-06-26 DIAGNOSIS — L500 Allergic urticaria: Secondary | ICD-10-CM

## 2016-06-26 NOTE — Patient Instructions (Addendum)
Urticaria  [name] has requested screening tests for celiac as she believes that her urticaria and GI symptoms are associated with gluten consumption.  A lab order form has been provided for celiac panel and IgA level.  When lab results have returned the patient will be called with further recommendations and follow up instructions.  Continue H1/H2 receptor blockade, titrating to the lowest effective dose necessary to suppress urticaria.  Other allergic rhinitis  Continue appropriate allergen avoidance measures.  Continue aeroallergen immunotherapy at the current dose and cetirizine 10 mg daily as needed.  After the first hard freeze, we will attempt to gradually increase the immunotherapy dose.  When the vials have expired or run out, we will mix into 3 separate vials to limit the possibility of large local reactions.  When lab results have returned the patient will be called with further recommendations and follow up instructions.

## 2016-06-26 NOTE — Assessment & Plan Note (Signed)
   Continue appropriate allergen avoidance measures.  Continue aeroallergen immunotherapy at the current dose and cetirizine 10 mg daily as needed.  After the first hard freeze, we will attempt to gradually increase the immunotherapy dose.  When the vials have expired or run out, we will mix into 3 separate vials to limit the possibility of large local reactions.

## 2016-06-26 NOTE — Assessment & Plan Note (Signed)
[name] has requested screening tests for celiac as she believes that her urticaria and GI symptoms are associated with gluten consumption.  A lab order form has been provided for celiac panel and IgA level.  When lab results have returned the patient will be called with further recommendations and follow up instructions.  Continue H1/H2 receptor blockade, titrating to the lowest effective dose necessary to suppress urticaria.

## 2016-06-26 NOTE — Progress Notes (Signed)
Follow-up Note  RE: Breanna Fuller L Barritt MRN: [id number] DOB: [birth year] Date of Office Visit: 06/26/2016  Primary care provider: Retia PasseBABB,JANCINTA, MD Referring provider: Retia PasseBabb, Jancinta, MD  History of present illness: Breanna Fuller is a 40 y.o. female with allergic rhinitis on aeroallergen immunotherapy and a history of urticaria presenting today for follow up.  She was last seen in this clinic 12/21/2015.  She reports that having removed gluten from her diet the urticaria has significantly improved, though not completely resolved.  Over this past weekend, she reintroduced gluten into her diet and the hives have recurred, though not to the same degree as previously.  She requests screening test for celiac disease these to be done because when she consumes gluten, she believes that she develops urticaria as well as bloating, flatulence, constipation, and joint pain.  Her maternal grandmother had celiac disease and her daughter is presumed to have celiac disease.  Her nasal allergy symptoms have improved while on immunotherapy.  She still takes cetirizine occasionally for allergy symptoms when the grass and pollen counts are high.  Due to large local reactions the immunotherapy buildup schedule had been halted and she has done well with the exception of one large local reaction which she notes occurred when the grass pollen counts were high.   Assessment and plan: Urticaria  Breanna Fuller has requested screening tests for celiac as she believes that her urticaria and GI symptoms are associated with gluten consumption.  A lab order form has been provided for celiac panel and IgA level.  When lab results have returned the patient will be called with further recommendations and follow up instructions.  Continue H1/H2 receptor blockade, titrating to the lowest effective dose necessary to suppress urticaria.  Other allergic rhinitis  Continue appropriate allergen avoidance measures.  Continue aeroallergen  immunotherapy at the current dose and cetirizine 10 mg daily as needed.  After the first hard freeze, we will attempt to gradually increase the immunotherapy dose.  When the vials have expired or run out, we will mix into 3 separate vials to limit the possibility of large local reactions.  Physical examination: Blood pressure 110/68, pulse 64, resp. rate 16.  General: Alert, interactive, in no acute distress. HEENT: TMs pearly gray, turbinates minimally edematous without discharge, post-pharynx mildly erythematous. Neck: Supple without lymphadenopathy. Lungs: Clear to auscultation without wheezing, rhonchi or rales. CV: Normal S1, S2 without murmurs. Skin: Warm and dry, without lesions or rashes.  The following portions of the patient's history were reviewed and updated as appropriate: allergies, current medications, past family history, past medical history, past social history, past surgical history and problem list.    Medication List       Accurate as of 06/26/16 12:24 PM. Always use your most recent med list.          azelastine 0.1 % nasal spray Commonly known as:  ASTELIN Place 1 spray into both nostrils as needed. Use in each nostril as directed   cetirizine 10 MG tablet Commonly known as:  ZYRTEC Take 10 mg by mouth daily.   doxepin 25 MG capsule Commonly known as:  SINEQUAN Take 1 capsule (25 mg total) by mouth at bedtime.   EPINEPHrine 0.3 mg/0.3 mL Soaj injection Commonly known as:  EPI-PEN Inject 0.3 mg into the muscle once as needed (for a severe allergic reaction).   levocetirizine 5 MG tablet Commonly known as:  XYZAL Take 1 tablet (5 mg total) by mouth every evening.   montelukast 10 MG tablet  Commonly known as:  SINGULAIR Take 1 tablet (10 mg total) by mouth at bedtime.   multivitamin tablet Take 1 tablet by mouth daily.   NORETHIN-ETH ESTRADIOL-FE PO Take by mouth.   norethindrone 0.35 MG tablet Commonly known as:  MICRONOR,CAMILA,ERRIN Take  1 tablet by mouth daily. Reported on 12/21/2015   PATADAY 0.2 % Soln Generic drug:  Olopatadine HCl Apply to eye once.   sertraline 50 MG tablet Commonly known as:  ZOLOFT Take 50 mg by mouth daily.       Allergies  Allergen Reactions  . Vancomycin Hives  . Latex Itching  . Monistat [Miconazole] Swelling  . Terazol [Terconazole] Swelling    I appreciate the opportunity to take part in Velvie's care. Please do not hesitate to contact me with questions.  Sincerely,   R. Jorene Guest, MD

## 2016-06-27 LAB — IGA: IgA: 326 mg/dL (ref 81–463)

## 2016-06-27 LAB — GLIADIN ANTIBODIES, SERUM
Gliadin IgA: 6 Units (ref <20)
Gliadin IgG: 3 Units (ref <20)

## 2016-06-27 LAB — TISSUE TRANSGLUTAMINASE, IGA: Tissue Transglutaminase Ab, IgA: 1 U/mL (ref <4)

## 2016-06-28 LAB — RETICULIN ANTIBODIES, IGA W TITER: Reticulin Ab, IgA: NEGATIVE

## 2016-07-10 ENCOUNTER — Ambulatory Visit (INDEPENDENT_AMBULATORY_CARE_PROVIDER_SITE_OTHER): Payer: PRIVATE HEALTH INSURANCE | Admitting: *Deleted

## 2016-07-10 DIAGNOSIS — J309 Allergic rhinitis, unspecified: Secondary | ICD-10-CM

## 2016-07-24 ENCOUNTER — Ambulatory Visit (INDEPENDENT_AMBULATORY_CARE_PROVIDER_SITE_OTHER): Payer: PRIVATE HEALTH INSURANCE

## 2016-07-24 DIAGNOSIS — J309 Allergic rhinitis, unspecified: Secondary | ICD-10-CM | POA: Diagnosis not present

## 2016-08-07 ENCOUNTER — Ambulatory Visit (INDEPENDENT_AMBULATORY_CARE_PROVIDER_SITE_OTHER): Payer: PRIVATE HEALTH INSURANCE | Admitting: *Deleted

## 2016-08-07 DIAGNOSIS — J3089 Other allergic rhinitis: Secondary | ICD-10-CM

## 2016-08-13 ENCOUNTER — Ambulatory Visit (INDEPENDENT_AMBULATORY_CARE_PROVIDER_SITE_OTHER): Payer: PRIVATE HEALTH INSURANCE | Admitting: *Deleted

## 2016-08-13 DIAGNOSIS — J3089 Other allergic rhinitis: Secondary | ICD-10-CM

## 2016-08-20 ENCOUNTER — Ambulatory Visit (INDEPENDENT_AMBULATORY_CARE_PROVIDER_SITE_OTHER): Payer: PRIVATE HEALTH INSURANCE | Admitting: *Deleted

## 2016-08-20 DIAGNOSIS — J3089 Other allergic rhinitis: Secondary | ICD-10-CM | POA: Diagnosis not present

## 2016-08-27 ENCOUNTER — Ambulatory Visit (INDEPENDENT_AMBULATORY_CARE_PROVIDER_SITE_OTHER): Payer: PRIVATE HEALTH INSURANCE

## 2016-08-27 DIAGNOSIS — J309 Allergic rhinitis, unspecified: Secondary | ICD-10-CM

## 2016-09-03 ENCOUNTER — Ambulatory Visit (INDEPENDENT_AMBULATORY_CARE_PROVIDER_SITE_OTHER): Payer: PRIVATE HEALTH INSURANCE | Admitting: *Deleted

## 2016-09-03 DIAGNOSIS — J309 Allergic rhinitis, unspecified: Secondary | ICD-10-CM | POA: Diagnosis not present

## 2016-09-17 ENCOUNTER — Ambulatory Visit (INDEPENDENT_AMBULATORY_CARE_PROVIDER_SITE_OTHER): Payer: PRIVATE HEALTH INSURANCE | Admitting: *Deleted

## 2016-09-17 DIAGNOSIS — J309 Allergic rhinitis, unspecified: Secondary | ICD-10-CM

## 2016-10-02 ENCOUNTER — Ambulatory Visit (INDEPENDENT_AMBULATORY_CARE_PROVIDER_SITE_OTHER): Payer: PRIVATE HEALTH INSURANCE | Admitting: *Deleted

## 2016-10-02 DIAGNOSIS — J309 Allergic rhinitis, unspecified: Secondary | ICD-10-CM | POA: Diagnosis not present

## 2016-10-16 ENCOUNTER — Ambulatory Visit (INDEPENDENT_AMBULATORY_CARE_PROVIDER_SITE_OTHER): Payer: PRIVATE HEALTH INSURANCE

## 2016-10-16 DIAGNOSIS — J309 Allergic rhinitis, unspecified: Secondary | ICD-10-CM

## 2016-11-01 ENCOUNTER — Ambulatory Visit (INDEPENDENT_AMBULATORY_CARE_PROVIDER_SITE_OTHER): Payer: PRIVATE HEALTH INSURANCE

## 2016-11-01 ENCOUNTER — Telehealth: Payer: Self-pay

## 2016-11-01 DIAGNOSIS — J309 Allergic rhinitis, unspecified: Secondary | ICD-10-CM

## 2016-11-01 NOTE — Telephone Encounter (Signed)
Pt is still reacting from Grass-Weed-Tree-Mite injection.   Blue Schedule A  Freeze @ 0.2 every 2 weeks   Recommending to decrease and freeze dose @ 0.15 every 2 weeks.  Please advise

## 2016-11-05 NOTE — Telephone Encounter (Signed)
What type of reaction?

## 2016-11-06 NOTE — Telephone Encounter (Signed)
What type of reaction?

## 2016-11-12 NOTE — Telephone Encounter (Signed)
What was her most recent ITx dose?

## 2016-11-12 NOTE — Telephone Encounter (Signed)
Per 11/05/16 immunotherapy notation -   Reaction: redness - Pt states swelling and hives gets worse then better.  Delay: 3+; redness; swollen             Pt came back in to have RUA assessed due to redness and now swelling.              Noticeable redness and swelling; no hives; Pt stated she did take Zyrtec 1-2 hours before arriving.

## 2016-11-13 ENCOUNTER — Ambulatory Visit (INDEPENDENT_AMBULATORY_CARE_PROVIDER_SITE_OTHER): Payer: PRIVATE HEALTH INSURANCE | Admitting: *Deleted

## 2016-11-13 DIAGNOSIS — J309 Allergic rhinitis, unspecified: Secondary | ICD-10-CM

## 2016-11-13 NOTE — Telephone Encounter (Signed)
0.2 ml

## 2016-11-13 NOTE — Telephone Encounter (Signed)
Drop back to 0.1 cc and stay at this dose for now.

## 2016-11-13 NOTE — Telephone Encounter (Signed)
Will notate under Immunotherapy.

## 2016-11-26 ENCOUNTER — Ambulatory Visit (INDEPENDENT_AMBULATORY_CARE_PROVIDER_SITE_OTHER): Payer: PRIVATE HEALTH INSURANCE | Admitting: *Deleted

## 2016-11-26 DIAGNOSIS — J309 Allergic rhinitis, unspecified: Secondary | ICD-10-CM

## 2016-12-04 NOTE — Addendum Note (Signed)
Addended by: Virl SonGAINEY, Zidane Renner D on: 12/04/2016 11:57 AM   Modules accepted: Orders

## 2016-12-11 ENCOUNTER — Ambulatory Visit (INDEPENDENT_AMBULATORY_CARE_PROVIDER_SITE_OTHER): Payer: PRIVATE HEALTH INSURANCE | Admitting: *Deleted

## 2016-12-11 DIAGNOSIS — J309 Allergic rhinitis, unspecified: Secondary | ICD-10-CM | POA: Diagnosis not present

## 2016-12-18 ENCOUNTER — Ambulatory Visit (INDEPENDENT_AMBULATORY_CARE_PROVIDER_SITE_OTHER): Payer: PRIVATE HEALTH INSURANCE

## 2016-12-18 DIAGNOSIS — J309 Allergic rhinitis, unspecified: Secondary | ICD-10-CM | POA: Diagnosis not present

## 2016-12-25 ENCOUNTER — Encounter: Payer: Self-pay | Admitting: Allergy and Immunology

## 2016-12-25 ENCOUNTER — Ambulatory Visit: Payer: Self-pay | Admitting: *Deleted

## 2016-12-25 ENCOUNTER — Ambulatory Visit (INDEPENDENT_AMBULATORY_CARE_PROVIDER_SITE_OTHER): Payer: PRIVATE HEALTH INSURANCE | Admitting: Allergy and Immunology

## 2016-12-25 VITALS — BP 102/60 | HR 66 | Temp 98.1°F | Resp 20 | Ht 67.5 in | Wt 130.6 lb

## 2016-12-25 DIAGNOSIS — J309 Allergic rhinitis, unspecified: Secondary | ICD-10-CM

## 2016-12-25 DIAGNOSIS — L500 Allergic urticaria: Secondary | ICD-10-CM | POA: Diagnosis not present

## 2016-12-25 DIAGNOSIS — J3089 Other allergic rhinitis: Secondary | ICD-10-CM

## 2016-12-25 NOTE — Assessment & Plan Note (Signed)
   Continue appropriate allergen avoidance measures, aeroallergen immunotherapy as prescribed and as tolerated, azelastine nasal spray as needed, and nasal saline spray as needed.  When the vials have expired or run out, we will mix into 3 separate vials to limit the possibility of large local reactions.

## 2016-12-25 NOTE — Progress Notes (Addendum)
Follow-up Note  RE: Breanna Fuller MRN: [id number] DOB: February 05, 1976 Date of Office Visit: 12/25/2016  Primary care provider: Retia Passe, MD Referring provider: Retia Passe, MD  History of present illness: Breanna Fuller is a 41 y.o. female with allergic rhinitis and history of chronic urticaria present today for follow up.  She last seen in this clinic in August 2017.  She reports that she rarely experiences hives, typically with stress or if she has eaten foods containing gluten.  She reports that her nasal and sinus symptoms have improved significantly since she has been on aeroallergen immunotherapy.  In fact, she states that she cannot recall the last time she had a sinus infection.  She rarely requires azelastine nasal spray or nasal saline spray.  She is tolerating aeroallergen immunotherapy injections, however her maintenance dose was established at a lower dose than the typical patient due to a history of large local reactions.   Assessment and plan: Other allergic rhinitis  Continue appropriate allergen avoidance measures, aeroallergen immunotherapy as prescribed and as tolerated, azelastine nasal spray as needed, and nasal saline spray as needed.  When the vials have expired or run out, we will mix into 3 separate vials to limit the possibility of large local reactions.  Urticaria  Continue cetirizine as needed.   Physical examination: Blood pressure 102/60, pulse 66, temperature 98.1 F (36.7 C), temperature source Oral, resp. rate 20, height 5' 7.5" (1.715 m), weight 130 lb 9.6 oz (59.2 kg), SpO2 97 %.  General: Alert, interactive, in no acute distress. HEENT: TMs pearly gray, turbinates minimally edematous without discharge, post-pharynx unremarkable. Neck: Supple without lymphadenopathy. Lungs: Clear to auscultation without wheezing, rhonchi or rales. CV: Normal S1, S2 without murmurs. Skin: Warm and dry, without lesions or rashes.  The following portions of  the patient's history were reviewed and updated as appropriate: allergies, current medications, past family history, past medical history, past social history, past surgical history and problem list.  Allergies as of 12/25/2016      Reactions   Vancomycin Hives   Latex Itching   Monistat [miconazole] Swelling   Terazol [terconazole] Swelling      Medication List       Accurate as of 12/25/16 11:59 PM. Always use your most recent med list.          azelastine 0.1 % nasal spray Commonly known as:  ASTELIN Place 1 spray into both nostrils as needed. Use in each nostril as directed   cetirizine 10 MG tablet Commonly known as:  ZYRTEC Take 10 mg by mouth daily.   EPINEPHrine 0.3 mg/0.3 mL Soaj injection Commonly known as:  EPI-PEN Inject 0.3 mg into the muscle once as needed (for a severe allergic reaction).   levocetirizine 5 MG tablet Commonly known as:  XYZAL Take 1 tablet (5 mg total) by mouth every evening.   montelukast 10 MG tablet Commonly known as:  SINGULAIR Take 1 tablet (10 mg total) by mouth at bedtime.   multivitamin tablet Take 1 tablet by mouth daily.   norethindrone 5 MG tablet Commonly known as:  AYGESTIN Take 5 mg by mouth daily.   PATADAY 0.2 % Soln Generic drug:  Olopatadine HCl Apply to eye once.   sertraline 100 MG tablet Commonly known as:  ZOLOFT Take 100 mg by mouth daily.       Allergies  Allergen Reactions  . Vancomycin Hives  . Latex Itching  . Monistat [Miconazole] Swelling  . Terazol [Terconazole] Swelling  I appreciate the opportunity to take part in Ruhee's care. Please do not hesitate to contact me with questions.  Sincerely,   R. Jorene Guestarter Loyal Holzheimer, MD

## 2016-12-25 NOTE — Assessment & Plan Note (Signed)
Continue cetirizine as needed 

## 2016-12-25 NOTE — Patient Instructions (Signed)
Other allergic rhinitis  Continue appropriate allergen avoidance measures, aeroallergen immunotherapy as prescribed and as tolerated, azelastine nasal spray as needed, and nasal saline spray as needed.  When the vials have expired or run out, we will mix into 3 separate vials to limit the possibility of large local reactions.  Urticaria  Continue cetirizine as needed.   Return in about 1 year (around 12/25/2017), or if symptoms worsen or fail to improve.

## 2016-12-28 NOTE — Addendum Note (Signed)
Addended by: Candis SchatzBOBBITT, Jasmond River C on: 12/28/2016 03:23 PM   Modules accepted: Orders

## 2017-01-07 NOTE — Addendum Note (Signed)
Addended by: Berna BueWHITAKER, CARRIE L on: 01/07/2017 11:59 AM   Modules accepted: Orders

## 2017-01-08 ENCOUNTER — Ambulatory Visit (INDEPENDENT_AMBULATORY_CARE_PROVIDER_SITE_OTHER): Payer: PRIVATE HEALTH INSURANCE | Admitting: *Deleted

## 2017-01-08 DIAGNOSIS — J309 Allergic rhinitis, unspecified: Secondary | ICD-10-CM | POA: Diagnosis not present

## 2017-01-22 ENCOUNTER — Ambulatory Visit (INDEPENDENT_AMBULATORY_CARE_PROVIDER_SITE_OTHER): Payer: PRIVATE HEALTH INSURANCE

## 2017-01-22 DIAGNOSIS — J309 Allergic rhinitis, unspecified: Secondary | ICD-10-CM | POA: Diagnosis not present

## 2017-01-22 NOTE — Progress Notes (Signed)
Patient started to vial set today. 3 vials.  Waited 30 minutes post injection.

## 2017-01-29 ENCOUNTER — Ambulatory Visit (INDEPENDENT_AMBULATORY_CARE_PROVIDER_SITE_OTHER): Payer: PRIVATE HEALTH INSURANCE | Admitting: *Deleted

## 2017-01-29 DIAGNOSIS — J309 Allergic rhinitis, unspecified: Secondary | ICD-10-CM | POA: Diagnosis not present

## 2017-02-05 ENCOUNTER — Ambulatory Visit (INDEPENDENT_AMBULATORY_CARE_PROVIDER_SITE_OTHER): Payer: PRIVATE HEALTH INSURANCE | Admitting: *Deleted

## 2017-02-05 DIAGNOSIS — J309 Allergic rhinitis, unspecified: Secondary | ICD-10-CM | POA: Diagnosis not present

## 2017-02-12 ENCOUNTER — Ambulatory Visit (INDEPENDENT_AMBULATORY_CARE_PROVIDER_SITE_OTHER): Payer: PRIVATE HEALTH INSURANCE | Admitting: *Deleted

## 2017-02-12 DIAGNOSIS — J309 Allergic rhinitis, unspecified: Secondary | ICD-10-CM | POA: Diagnosis not present

## 2017-02-19 ENCOUNTER — Ambulatory Visit (INDEPENDENT_AMBULATORY_CARE_PROVIDER_SITE_OTHER): Payer: PRIVATE HEALTH INSURANCE | Admitting: *Deleted

## 2017-02-19 DIAGNOSIS — J309 Allergic rhinitis, unspecified: Secondary | ICD-10-CM | POA: Diagnosis not present

## 2017-02-26 ENCOUNTER — Ambulatory Visit (INDEPENDENT_AMBULATORY_CARE_PROVIDER_SITE_OTHER): Payer: PRIVATE HEALTH INSURANCE | Admitting: *Deleted

## 2017-02-26 DIAGNOSIS — J309 Allergic rhinitis, unspecified: Secondary | ICD-10-CM | POA: Diagnosis not present

## 2017-03-06 ENCOUNTER — Ambulatory Visit (INDEPENDENT_AMBULATORY_CARE_PROVIDER_SITE_OTHER): Payer: PRIVATE HEALTH INSURANCE | Admitting: *Deleted

## 2017-03-06 DIAGNOSIS — J309 Allergic rhinitis, unspecified: Secondary | ICD-10-CM

## 2017-03-11 ENCOUNTER — Telehealth: Payer: Self-pay | Admitting: Allergy and Immunology

## 2017-03-11 ENCOUNTER — Other Ambulatory Visit: Payer: Self-pay

## 2017-03-11 MED ORDER — FLUTICASONE PROPIONATE 50 MCG/ACT NA SUSP: 2.0000 | Freq: Every day | NASAL | 5 refills | Status: DC | PRN

## 2017-03-11 NOTE — Telephone Encounter (Signed)
I would recommend using azelastine nasal spray 2 sprays per nostril twice daily for now and possibly adding fluticasone nasal spray. Nasal saline lavage (NeilMed) as needed and prior to medicated nasal sprays. For thick post nasal drainage, chest congestion, nasal congestion, and/or sinus pressure, add guaifenesin 1200 mg (Mucinex Maximum Strength) plus/minus pseudoephedrine 120 mg  twice daily as needed with adequate hydration as discussed. Pseudoephedrine is only to be used for short-term relief of nasal/sinus congestion. Long-term use is discouraged due to potential side effects. If symptoms persist or progress, or if she becomes febrile, have her come in for an office visit.  Thanks.

## 2017-03-11 NOTE — Telephone Encounter (Signed)
Patient thinks she has a sinus infection that has gone into her chest - does she needs to be seen or is DR BOBBITT willing to call her in something to [facility] in [place]? Please call patient to answer any questions

## 2017-03-11 NOTE — Telephone Encounter (Signed)
Spoke with Breanna Fuller and informed her of what dr Nunzio CobbsBobbitt has stated. She informed me that she did not have the flonase so I have sent it to pharmacy.

## 2017-03-11 NOTE — Telephone Encounter (Signed)
Dr Bobbitt please advise 

## 2017-03-19 ENCOUNTER — Telehealth: Payer: Self-pay | Admitting: *Deleted

## 2017-03-19 NOTE — Telephone Encounter (Signed)
Patient came in and states that she is not any better from last week. She has been using both nasal sprays and the mucinex and nothing is helping. She is still having lots of sinus pressure and now having teeth pain. What else can she do please advise? Also can patient get injection?

## 2017-03-19 NOTE — Telephone Encounter (Signed)
Left message for patient to call office in the morning.

## 2017-03-19 NOTE — Telephone Encounter (Signed)
Please call in prednisone, 40 mg x3 days, 20 mg x1 day, 10 mg x1 day, then stop. Continue interventions as recommended on the previous phone order. Please ask her to let us know if she becomes febrile and we will call in an antibiotic.  Thanks.

## 2017-03-20 ENCOUNTER — Ambulatory Visit (INDEPENDENT_AMBULATORY_CARE_PROVIDER_SITE_OTHER): Payer: PRIVATE HEALTH INSURANCE | Admitting: *Deleted

## 2017-03-20 DIAGNOSIS — J309 Allergic rhinitis, unspecified: Secondary | ICD-10-CM | POA: Diagnosis not present

## 2017-03-20 NOTE — Telephone Encounter (Signed)
Informed patient of Prednisone. She will pick up this afternoon.

## 2017-03-27 ENCOUNTER — Ambulatory Visit (INDEPENDENT_AMBULATORY_CARE_PROVIDER_SITE_OTHER): Payer: PRIVATE HEALTH INSURANCE

## 2017-03-27 DIAGNOSIS — J309 Allergic rhinitis, unspecified: Secondary | ICD-10-CM

## 2017-04-02 ENCOUNTER — Ambulatory Visit (INDEPENDENT_AMBULATORY_CARE_PROVIDER_SITE_OTHER): Payer: PRIVATE HEALTH INSURANCE | Admitting: *Deleted

## 2017-04-02 DIAGNOSIS — J309 Allergic rhinitis, unspecified: Secondary | ICD-10-CM | POA: Diagnosis not present

## 2017-04-09 ENCOUNTER — Ambulatory Visit (INDEPENDENT_AMBULATORY_CARE_PROVIDER_SITE_OTHER): Payer: PRIVATE HEALTH INSURANCE

## 2017-04-09 DIAGNOSIS — J309 Allergic rhinitis, unspecified: Secondary | ICD-10-CM | POA: Diagnosis not present

## 2017-04-16 ENCOUNTER — Ambulatory Visit (INDEPENDENT_AMBULATORY_CARE_PROVIDER_SITE_OTHER): Payer: PRIVATE HEALTH INSURANCE | Admitting: *Deleted

## 2017-04-16 DIAGNOSIS — J309 Allergic rhinitis, unspecified: Secondary | ICD-10-CM

## 2017-04-19 ENCOUNTER — Ambulatory Visit
Admission: RE | Admit: 2017-04-19 | Discharge: 2017-04-19 | Disposition: A | Discharge status: Home / Self Care | Payer: PRIVATE HEALTH INSURANCE | Source: Ambulatory Visit | Attending: Family Medicine | Admitting: Family Medicine

## 2017-04-19 ENCOUNTER — Other Ambulatory Visit: Payer: Self-pay | Admitting: Family Medicine

## 2017-04-19 DIAGNOSIS — M79671 Pain in right foot: Secondary | ICD-10-CM

## 2017-04-23 ENCOUNTER — Ambulatory Visit (INDEPENDENT_AMBULATORY_CARE_PROVIDER_SITE_OTHER): Payer: PRIVATE HEALTH INSURANCE

## 2017-04-23 DIAGNOSIS — J309 Allergic rhinitis, unspecified: Secondary | ICD-10-CM | POA: Diagnosis not present

## 2017-04-30 ENCOUNTER — Other Ambulatory Visit: Payer: Self-pay | Admitting: Family Medicine

## 2017-04-30 DIAGNOSIS — M79671 Pain in right foot: Secondary | ICD-10-CM

## 2017-05-02 ENCOUNTER — Telehealth: Payer: Self-pay

## 2017-05-02 MED ORDER — EPINEPHRINE 0.3 MG/0.3ML IJ SOAJ
0.3000 mg | Freq: Once | INTRAMUSCULAR | 2 refills | Status: AC
Start: 2017-05-02 — End: 2017-05-02

## 2017-05-02 NOTE — Telephone Encounter (Signed)
Pt states she called for an epi-pen refill last week, but there is no documentation.   I advised the pt that I will send in an rx for Auvi-Q due to her insurance coverage. I advised her that [facility]  I also advised her that she can come in for a quick teaching this week or wait until her next injection next week to be shown how to use it.   She has no questions or concerns.

## 2017-05-03 ENCOUNTER — Ambulatory Visit
Admission: RE | Admit: 2017-05-03 | Discharge: 2017-05-03 | Disposition: A | Discharge status: Home / Self Care | Payer: PRIVATE HEALTH INSURANCE | Source: Ambulatory Visit | Attending: Family Medicine | Admitting: Family Medicine

## 2017-05-03 DIAGNOSIS — M79671 Pain in right foot: Secondary | ICD-10-CM

## 2017-05-06 ENCOUNTER — Ambulatory Visit (INDEPENDENT_AMBULATORY_CARE_PROVIDER_SITE_OTHER): Payer: PRIVATE HEALTH INSURANCE

## 2017-05-06 DIAGNOSIS — J309 Allergic rhinitis, unspecified: Secondary | ICD-10-CM | POA: Diagnosis not present

## 2017-05-07 ENCOUNTER — Inpatient Hospital Stay
Admission: RE | Admit: 2017-05-07 | Discharge: 2017-05-07 | Disposition: A | Discharge status: Home / Self Care | Payer: PRIVATE HEALTH INSURANCE | Source: Ambulatory Visit | Attending: Family Medicine | Admitting: Family Medicine

## 2017-05-13 ENCOUNTER — Ambulatory Visit (INDEPENDENT_AMBULATORY_CARE_PROVIDER_SITE_OTHER): Payer: PRIVATE HEALTH INSURANCE | Admitting: *Deleted

## 2017-05-13 DIAGNOSIS — J309 Allergic rhinitis, unspecified: Secondary | ICD-10-CM | POA: Diagnosis not present

## 2017-05-20 ENCOUNTER — Ambulatory Visit (INDEPENDENT_AMBULATORY_CARE_PROVIDER_SITE_OTHER): Payer: PRIVATE HEALTH INSURANCE

## 2017-05-20 DIAGNOSIS — J309 Allergic rhinitis, unspecified: Secondary | ICD-10-CM

## 2017-05-28 ENCOUNTER — Ambulatory Visit (INDEPENDENT_AMBULATORY_CARE_PROVIDER_SITE_OTHER): Payer: PRIVATE HEALTH INSURANCE | Admitting: *Deleted

## 2017-05-28 DIAGNOSIS — J309 Allergic rhinitis, unspecified: Secondary | ICD-10-CM

## 2017-06-05 ENCOUNTER — Ambulatory Visit (INDEPENDENT_AMBULATORY_CARE_PROVIDER_SITE_OTHER): Payer: PRIVATE HEALTH INSURANCE | Admitting: Allergy & Immunology

## 2017-06-05 ENCOUNTER — Ambulatory Visit: Payer: Self-pay | Admitting: Allergy & Immunology

## 2017-06-05 ENCOUNTER — Encounter: Payer: Self-pay | Admitting: Allergy & Immunology

## 2017-06-05 VITALS — BP 112/60 | HR 64 | Temp 98.3°F | Resp 16

## 2017-06-05 DIAGNOSIS — L270 Generalized skin eruption due to drugs and medicaments taken internally: Secondary | ICD-10-CM | POA: Diagnosis not present

## 2017-06-05 DIAGNOSIS — J0100 Acute maxillary sinusitis, unspecified: Secondary | ICD-10-CM | POA: Diagnosis not present

## 2017-06-05 MED ORDER — AMOXICILLIN 875 MG PO TABS: 875.0000 mg | ORAL_TABLET | Freq: Two times a day (BID) | ORAL | 0 refills | Status: AC

## 2017-06-05 NOTE — Progress Notes (Signed)
FOLLOW UP  Date of Service/Encounter:  06/05/17   Assessment:   Acute non-recurrent maxillary sinusitis  Drug rash versus viral-induced urticaria  Perennial and seasonal allergic rhinitis (ragweed, molds, cat, cockroach, trees, dust mites, grass, weeds)   Plan/Recommendations:   1. Acute non-recurrent maxillary sinusitis - With your current symptoms and time course, antibiotics might be needed.  - If symptoms are not improving in 1-2 days, fill the amoxicillin prescription provided.  - Continue with nasal saline spray (i.e., Simply Saline) or nasal saline lavage (i.e., NeilMed) twice daily.  - For thick post nasal drainage, add guaifenesin [phone] mg (Mucinex)  twice daily as needed with adequate hydration as discussed. - Restart nasal sprays: Flonase two sprays per nostril 1-2 times daily + azelastine two sprays per nostril 1-2 times daily.   2. Drug reaction - likely Wellbutrin as a culprit - This is difficult to determine, as the urticarial rash might be related to an underlying viral infection. - The prednisone pack provided for the sinusitis should help resolves the urticaria.  - In two weeks once you are over this upper respiratory infection, you can try restarting the Wellbutrin at home. - The time line is not entirely consistent with a drug allergy since the time between ingesting the Wellbutrin and the onset of symptoms was separated by a rather prolonged time course (>8 hours). Breanna Fuller does have an EpiPen at home, should anything happen at home with the Wellbutrin re-introduction. - Breanna Fuller is aware of the risks and willing to do this at her home.  3. Large local reactions to allergen immunotherapy - One good option for Breanna Fuller is to initiate Xolair, which could get approved with the history of urticaria. - Having Xolair on board could help advance her allergen immunotherapy.  - There have been many case reports that utilize Xolair as a means of advancing immunotherapy  without the adverse reactions.   4. Return in about 3 months (around 09/05/2017).    Subjective:   Breanna Fuller is a 41 y.o. female presenting today for follow up of  Chief Complaint  Patient presents with  . Follow-up  . Allergic Reaction  . Nasal Congestion    Breanna Fuller has a history of the following: Patient Active Problem List   Diagnosis Date Noted  . GI symptoms 06/26/2016  . Urticaria 12/21/2015  . Other allergic rhinitis 08/09/2015    History obtained from: chart review and patient.  Breanna Fuller's Primary Care Provider is Breanna Passe, MD.     Breanna Fuller is a 41 y.o. female presenting for a sick visit. She was last seen in February 2018. At that time, Dr. Nunzio Fuller decided to re-mix her immunotherapy into three separate vials to help decrease the frequency of large local reactions. She was continued on azelastine nasal sprays as needed in conjunction with nasal saline rinses.   Since the last visit, she reports that she has been having a multitude of problems:   Problem #1: She started having a sore throat and drainage on Friday. Now she has developed the pressure and sore chest. She had a low grade fever and achiness. She did take Dayquil with minimal relief. She does endorse some chest tightness. She does not remember the last time that she had an antibiotic.   Problem #2: She also reports that she started Wellbutrin last night and developed a a pruritic rash over her arms, noted after taking a shower this morning. She also has similar lesions on her  legs. She has not taken any addition Wellbutrin. She took the doctor who prescribed it and she was told that she could restart the Zoloft. She does get hives from gluten, but not from any other triggers. She does not get cholinergic urticaria. She was evaluated for urticaria in February 2017, which is when she was diagnosed with the gluten sensitivity. Aside from her current illness (see Problem #1 above), she has had no  recent illnesses.   Problem #3: [name] is on allergen immunotherapy and has a history of large local reactions. These have unfortunately dampened the increase in her advance. She receives three injections. Immunotherapy script #1 contains trees and dust mites. She currently receives 0.9325mL of the GOLD vial (1/10,000). Immunotherapy script #2 contains weeds and grasses. She currently receives 0.8330mL of the BLUE vial (1/100,000). Immunotherapy script #3 contains molds, cat and cockroach. She currently receives 0.8425mL of the GOLD vial (1/10,000). She started shots July of 2015 and has not reached maintenance yet. Despite not reaching maintenance yet, she still feels that her allergy shots have helped to decrease her frequency of sinusitis. Prior to starting allergy shots, she tells me that she was getting sinus infections every other month. At this point, she cannot remember the last time that she had antibiotics.   Otherwise, there have been no changes to her past medical history, surgical history, family history, or social history.    Review of Systems: a 14-point review of systems is pertinent for what is mentioned in HPI.  Otherwise, all other systems were negative. Constitutional: negative other than that listed in the HPI Eyes: negative other than that listed in the HPI Ears, nose, mouth, throat, and face: negative other than that listed in the HPI Respiratory: negative other than that listed in the HPI Cardiovascular: negative other than that listed in the HPI Gastrointestinal: negative other than that listed in the HPI Genitourinary: negative other than that listed in the HPI Integument: negative other than that listed in the HPI Hematologic: negative other than that listed in the HPI Musculoskeletal: negative other than that listed in the HPI Neurological: negative other than that listed in the HPI Allergy/Immunologic: negative other than that listed in the HPI    Objective:   Blood  pressure 112/60, pulse 64, temperature 98.3 F (36.8 C), temperature source Oral, resp. rate 16. There is no height or weight on file to calculate BMI.   Physical Exam:  General: Alert, interactive. Pleasant, but clearly in distress.  Eyes: No conjunctival injection present on the right, No conjunctival injection present on the left, PERRL bilaterally, No discharge on the right, No discharge on the left, No Horner-Trantas dots present and allergic shiners present bilaterally Ears: Right TM pearly gray with normal light reflex, Left TM pearly gray with normal light reflex, Right TM intact without perforation and Left TM intact without perforation.  Nose/Throat: External nose within normal limits, nasal crease present and septum midline, turbinates edematous and pale with thick discharge, post-pharynx erythematous with cobblestoning in the posterior oropharynx. Tonsils 2+ without exudates Neck: Supple without thyromegaly. Lungs: Clear to auscultation without wheezing, rhonchi or rales. No increased work of breathing. CV: Normal S1/S2, no murmurs. Capillary refill <2 seconds.  Skin: Scattered erythematous urticarial type lesions primarily located bilateral arms with excoriation marks present, nonvesicular. Neuro:   Grossly intact. No focal deficits appreciated. Responsive to questions.   Diagnostic studies: none      Malachi BondsJoel Jazara Swiney, MD [facility] [place] [place]

## 2017-06-05 NOTE — Patient Instructions (Addendum)
1. Acute non-recurrent maxillary sinusitis - With your current symptoms and time course, antibiotics might be needed.  - If symptoms are not improving in 1-2 days, fill the amoxicillin prescription provided.  - Continue with nasal saline spray (i.e., Simply Saline) or nasal saline lavage (i.e., NeilMed) twice daily.  - For thick post nasal drainage, add guaifenesin [id number] mg (Mucinex)  twice daily as needed with adequate hydration as discussed. - Restart nasal sprays: Flonase two sprays per nostril 1-2 times daily + azelastine two sprays per nostril 1-2 times daily.   2. Drug reaction - likely Wellbutrin as a culprit - This is difficult to determine, as this might be related to an underlying viral infection. - In two weeks once you are over this upper respiratory infection, you can try restarting the Wellbutrin at home.  3. Return in about 3 months (around 09/05/2017).   Please inform us of any Emergency Department visits, hospitalizations, or changes in symptoms. Call us before going to the ED for breathing or allergy symptoms since we might be able to fit you in for a sick visit. Feel free to contact us anytime with any questions, problems, or concerns.  It was a pleasure to meet you today! Enjoy the rest of your summer!   Websites that have reliable patient information: 1. American Academy of Asthma, Allergy, and Immunology: www.aaaai.org 2. Food Allergy Research and Education (FARE): foodallergy.org 3. Mothers of Asthmatics: http://www.asthmacommunitynetwork.org 4. American College of Allergy, Asthma, and Immunology: www.acaai.org

## 2017-06-12 ENCOUNTER — Ambulatory Visit (INDEPENDENT_AMBULATORY_CARE_PROVIDER_SITE_OTHER): Payer: PRIVATE HEALTH INSURANCE

## 2017-06-12 DIAGNOSIS — J309 Allergic rhinitis, unspecified: Secondary | ICD-10-CM | POA: Diagnosis not present

## 2017-06-12 NOTE — Progress Notes (Signed)
Patient developed small hive in her right arm within 3 minutes of injection. Administered 10 mg Zyrtec in office. Patient also on antibiotics, which she informed [name], CMA after I had given injection. She has been off of them for 48 hours.

## 2017-06-19 ENCOUNTER — Ambulatory Visit (INDEPENDENT_AMBULATORY_CARE_PROVIDER_SITE_OTHER): Payer: PRIVATE HEALTH INSURANCE | Admitting: *Deleted

## 2017-06-19 DIAGNOSIS — J309 Allergic rhinitis, unspecified: Secondary | ICD-10-CM | POA: Diagnosis not present

## 2017-06-25 ENCOUNTER — Encounter: Payer: Self-pay | Admitting: *Deleted

## 2017-06-26 ENCOUNTER — Ambulatory Visit (INDEPENDENT_AMBULATORY_CARE_PROVIDER_SITE_OTHER): Payer: PRIVATE HEALTH INSURANCE

## 2017-06-26 DIAGNOSIS — J309 Allergic rhinitis, unspecified: Secondary | ICD-10-CM | POA: Diagnosis not present

## 2017-07-03 ENCOUNTER — Ambulatory Visit (INDEPENDENT_AMBULATORY_CARE_PROVIDER_SITE_OTHER): Payer: PRIVATE HEALTH INSURANCE | Admitting: *Deleted

## 2017-07-03 DIAGNOSIS — J309 Allergic rhinitis, unspecified: Secondary | ICD-10-CM

## 2017-07-10 ENCOUNTER — Ambulatory Visit (INDEPENDENT_AMBULATORY_CARE_PROVIDER_SITE_OTHER): Payer: PRIVATE HEALTH INSURANCE

## 2017-07-10 DIAGNOSIS — J309 Allergic rhinitis, unspecified: Secondary | ICD-10-CM

## 2017-07-17 ENCOUNTER — Ambulatory Visit (INDEPENDENT_AMBULATORY_CARE_PROVIDER_SITE_OTHER): Payer: PRIVATE HEALTH INSURANCE

## 2017-07-17 DIAGNOSIS — J309 Allergic rhinitis, unspecified: Secondary | ICD-10-CM

## 2017-07-24 ENCOUNTER — Ambulatory Visit (INDEPENDENT_AMBULATORY_CARE_PROVIDER_SITE_OTHER): Payer: PRIVATE HEALTH INSURANCE | Admitting: *Deleted

## 2017-07-24 DIAGNOSIS — J309 Allergic rhinitis, unspecified: Secondary | ICD-10-CM

## 2017-08-07 ENCOUNTER — Ambulatory Visit (INDEPENDENT_AMBULATORY_CARE_PROVIDER_SITE_OTHER): Payer: PRIVATE HEALTH INSURANCE | Admitting: *Deleted

## 2017-08-07 DIAGNOSIS — J309 Allergic rhinitis, unspecified: Secondary | ICD-10-CM | POA: Diagnosis not present

## 2017-08-13 ENCOUNTER — Ambulatory Visit (INDEPENDENT_AMBULATORY_CARE_PROVIDER_SITE_OTHER): Payer: PRIVATE HEALTH INSURANCE | Admitting: *Deleted

## 2017-08-13 DIAGNOSIS — J309 Allergic rhinitis, unspecified: Secondary | ICD-10-CM

## 2017-08-20 ENCOUNTER — Ambulatory Visit (INDEPENDENT_AMBULATORY_CARE_PROVIDER_SITE_OTHER): Payer: PRIVATE HEALTH INSURANCE | Admitting: *Deleted

## 2017-08-20 DIAGNOSIS — J309 Allergic rhinitis, unspecified: Secondary | ICD-10-CM

## 2017-08-27 ENCOUNTER — Ambulatory Visit (INDEPENDENT_AMBULATORY_CARE_PROVIDER_SITE_OTHER): Payer: PRIVATE HEALTH INSURANCE | Admitting: *Deleted

## 2017-08-27 DIAGNOSIS — J309 Allergic rhinitis, unspecified: Secondary | ICD-10-CM | POA: Diagnosis not present

## 2017-09-03 ENCOUNTER — Ambulatory Visit (INDEPENDENT_AMBULATORY_CARE_PROVIDER_SITE_OTHER): Payer: PRIVATE HEALTH INSURANCE | Admitting: *Deleted

## 2017-09-03 DIAGNOSIS — J309 Allergic rhinitis, unspecified: Secondary | ICD-10-CM

## 2017-09-11 ENCOUNTER — Ambulatory Visit (INDEPENDENT_AMBULATORY_CARE_PROVIDER_SITE_OTHER): Payer: PRIVATE HEALTH INSURANCE | Admitting: *Deleted

## 2017-09-11 DIAGNOSIS — J309 Allergic rhinitis, unspecified: Secondary | ICD-10-CM

## 2017-09-18 ENCOUNTER — Ambulatory Visit (INDEPENDENT_AMBULATORY_CARE_PROVIDER_SITE_OTHER): Payer: PRIVATE HEALTH INSURANCE

## 2017-09-18 DIAGNOSIS — J309 Allergic rhinitis, unspecified: Secondary | ICD-10-CM

## 2017-09-24 ENCOUNTER — Ambulatory Visit (INDEPENDENT_AMBULATORY_CARE_PROVIDER_SITE_OTHER): Payer: PRIVATE HEALTH INSURANCE | Admitting: *Deleted

## 2017-09-24 DIAGNOSIS — J309 Allergic rhinitis, unspecified: Secondary | ICD-10-CM | POA: Diagnosis not present

## 2017-10-02 ENCOUNTER — Ambulatory Visit (INDEPENDENT_AMBULATORY_CARE_PROVIDER_SITE_OTHER): Payer: PRIVATE HEALTH INSURANCE

## 2017-10-02 DIAGNOSIS — J309 Allergic rhinitis, unspecified: Secondary | ICD-10-CM

## 2017-10-09 ENCOUNTER — Ambulatory Visit (INDEPENDENT_AMBULATORY_CARE_PROVIDER_SITE_OTHER): Payer: PRIVATE HEALTH INSURANCE | Admitting: *Deleted

## 2017-10-09 DIAGNOSIS — J309 Allergic rhinitis, unspecified: Secondary | ICD-10-CM

## 2017-10-16 ENCOUNTER — Ambulatory Visit (INDEPENDENT_AMBULATORY_CARE_PROVIDER_SITE_OTHER): Payer: PRIVATE HEALTH INSURANCE | Admitting: Family Medicine

## 2017-10-16 DIAGNOSIS — J309 Allergic rhinitis, unspecified: Secondary | ICD-10-CM | POA: Diagnosis not present

## 2017-10-23 ENCOUNTER — Ambulatory Visit (INDEPENDENT_AMBULATORY_CARE_PROVIDER_SITE_OTHER): Payer: PRIVATE HEALTH INSURANCE

## 2017-10-23 DIAGNOSIS — J309 Allergic rhinitis, unspecified: Secondary | ICD-10-CM | POA: Diagnosis not present

## 2017-10-30 ENCOUNTER — Ambulatory Visit (INDEPENDENT_AMBULATORY_CARE_PROVIDER_SITE_OTHER): Payer: PRIVATE HEALTH INSURANCE

## 2017-10-30 ENCOUNTER — Ambulatory Visit: Payer: PRIVATE HEALTH INSURANCE

## 2017-10-30 DIAGNOSIS — J309 Allergic rhinitis, unspecified: Secondary | ICD-10-CM | POA: Diagnosis not present

## 2017-10-31 ENCOUNTER — Telehealth: Payer: Self-pay

## 2017-10-31 NOTE — Telephone Encounter (Signed)
FYI

## 2017-10-31 NOTE — Telephone Encounter (Signed)
Let's see how she does with with the following maintenance doses:  Tree/DM: Green 0.5 cc  Mold/Cat/CR: Green 0.3 cc  Grass/Weed: Blue 0.1 cc  Thanks and let me know if you have any questions about it.

## 2017-10-31 NOTE — Telephone Encounter (Deleted)
Since she has reacted with all of her Red Vials, let's drop her down to 0.675mL of the [employer] Vial and make this her maintenance. We will see how she does for that.   Malachi BondsJoel Gallagher, MD [facility] [place] [place]

## 2017-10-31 NOTE — Telephone Encounter (Signed)
Patient came in for immunotherapy yesterday and informed me that with her last set of injections she did have some very large delayed reactions. Per protocol she was back down according to the dosing schedules. She has had multiple reactions like this and I believe 1 or 2 cases of systemic reactions. Please advise on whether or not patient should be frozen at a specific dose/vial or discontinue injections altogether due to frequency of reactions (local and systemic). She does take her antihistamine prior to all injections as instructed.   10-30-16 Injection Flowsheet Immunotherapy Allergen   Row Name  10/30/17 1600          Pre-Administration Allergen  Signed Consent  Yes          Reaction with last injection  No  RW-MOLD-CAT-CR 4++          Pregnant  No          Patient Taking Beta-Blocker/Ace Inhibitor  No          Are you ill or fever today  No          Asthma exacerbation or symptoms  No          Epi-Pen  Yes          Allergen (VERIFY CORRECT VIAL)  Allergen  TREE-DM          Expiration Date   01/14/18          Vial Concentration   1:100 (Red)          Schedule   A          Schedule Comments  1 time weekly          Dose (mL)   0.1          Location   [facility]          Reaction   1+          Given by   kblack, cma          Additional Allergens  Yes          Allergen (VERIFY CORRECT VIAL)  Allergen  MOLD-C-CR          Expiration Date   01/14/18          Vial Concentration   1:1000 (Green)          Schedule   A          Schedule Comments  WEEKLY          Dose (mL)   0.4          Location   [facility]          Reaction   1+          Delayed Reaction   4+          Additional Allergens  Yes          Allergen (VERIFY CORRECT VIAL)  Allergen  GRASS-WEED          Expiration Date  01/14/18          Vial Concentration  1:100,000 (Blu- e)          Schedule  A          Schedule Comments  1 time weekly          Dose (mL)  0.15          Location  [facility]          Reaction  1+  Delayed  Reaction  4+          Given by  kblack, cma            10-23-17 Injection Flowsheet Immunotherapy Allergen   Row Name  10/23/17 1600          Pre-Administration Allergen  Signed Consent  Yes          Reaction with last injection  No  RW-MOLD-CAT-CR 4++          Pregnant  No          Patient Taking Beta-Blocker/Ace Inhibitor  No          Are you ill or fever today  No          Asthma exacerbation or symptoms  No          Epi-Pen  Yes          Allergen (VERIFY CORRECT VIAL)  Allergen  TREE-DM          Expiration Date   01/14/18          Vial Concentration   1:100 (Red)          Schedule   A          Schedule Comments  1 time weekly          Dose (mL)   0.05          Location   [facility]          Reaction   0          Given by   AW          Additional Allergens  Yes          Allergen (VERIFY CORRECT VIAL)  Allergen  MOLD-C-CR          Expiration Date   01/14/18          Vial Concentration   1:100 (Red)          Schedule   A          Schedule Comments  WEEKLY          Dose (mL)   0.05          Location   [facility]          Reaction   0          Given by   AW          Additional Allergens  Yes          Allergen (VERIFY CORRECT VIAL)  Allergen  GRASS-WEED          Expiration Date  01/14/18          Vial Concentration  1:100,000 (Blu- e)          Schedule  A          Schedule Comments  1 time weekly          Dose (mL)  0.25          Location  [facility]          Reaction  1+          Given by  AW

## 2017-10-31 NOTE — Telephone Encounter (Signed)
Would you like to change the maintenance dose on the blue vial as well?

## 2017-10-31 NOTE — Telephone Encounter (Signed)
Please advise 

## 2017-10-31 NOTE — Telephone Encounter (Signed)
I apologize, I saw Dr. Dellis AnesGallagher as her last visit and sent this to him. Did not realize you had provided the rx for shots. Are you agreeable with Dr. Dellis AnesGallagher about changing her maintenance dose to green?    Also, would you like to make any changes to her blue vial?   Cristela FeltHeather Vernon, CMA has pictures available from last injection on 10/30/17.

## 2017-11-01 NOTE — Telephone Encounter (Addendum)
Informed patient will discuss new maintenance with her next injection. Patient states arm is better today but did get worse yesterday afternoon and last night.

## 2017-11-05 ENCOUNTER — Ambulatory Visit (INDEPENDENT_AMBULATORY_CARE_PROVIDER_SITE_OTHER): Payer: PRIVATE HEALTH INSURANCE | Admitting: *Deleted

## 2017-11-05 DIAGNOSIS — J309 Allergic rhinitis, unspecified: Secondary | ICD-10-CM | POA: Diagnosis not present

## 2017-11-12 ENCOUNTER — Ambulatory Visit (INDEPENDENT_AMBULATORY_CARE_PROVIDER_SITE_OTHER): Payer: PRIVATE HEALTH INSURANCE | Admitting: *Deleted

## 2017-11-12 DIAGNOSIS — J309 Allergic rhinitis, unspecified: Secondary | ICD-10-CM | POA: Diagnosis not present

## 2017-11-19 ENCOUNTER — Ambulatory Visit (INDEPENDENT_AMBULATORY_CARE_PROVIDER_SITE_OTHER): Payer: PRIVATE HEALTH INSURANCE | Admitting: *Deleted

## 2017-11-19 DIAGNOSIS — J309 Allergic rhinitis, unspecified: Secondary | ICD-10-CM | POA: Diagnosis not present

## 2017-11-20 ENCOUNTER — Other Ambulatory Visit: Payer: Self-pay | Admitting: Family Medicine

## 2017-11-20 DIAGNOSIS — Z1231 Encounter for screening mammogram for malignant neoplasm of breast: Secondary | ICD-10-CM

## 2017-11-26 ENCOUNTER — Ambulatory Visit (INDEPENDENT_AMBULATORY_CARE_PROVIDER_SITE_OTHER): Payer: PRIVATE HEALTH INSURANCE

## 2017-11-26 DIAGNOSIS — J309 Allergic rhinitis, unspecified: Secondary | ICD-10-CM | POA: Diagnosis not present

## 2017-12-03 ENCOUNTER — Ambulatory Visit (INDEPENDENT_AMBULATORY_CARE_PROVIDER_SITE_OTHER): Payer: PRIVATE HEALTH INSURANCE | Admitting: *Deleted

## 2017-12-03 DIAGNOSIS — J309 Allergic rhinitis, unspecified: Secondary | ICD-10-CM | POA: Diagnosis not present

## 2017-12-10 ENCOUNTER — Ambulatory Visit (INDEPENDENT_AMBULATORY_CARE_PROVIDER_SITE_OTHER): Payer: PRIVATE HEALTH INSURANCE | Admitting: *Deleted

## 2017-12-10 DIAGNOSIS — J309 Allergic rhinitis, unspecified: Secondary | ICD-10-CM | POA: Diagnosis not present

## 2017-12-11 ENCOUNTER — Encounter (INDEPENDENT_AMBULATORY_CARE_PROVIDER_SITE_OTHER): Payer: Self-pay

## 2017-12-11 ENCOUNTER — Ambulatory Visit
Admission: RE | Admit: 2017-12-11 | Discharge: 2017-12-11 | Disposition: A | Discharge status: Home / Self Care | Payer: PRIVATE HEALTH INSURANCE | Source: Ambulatory Visit | Attending: Family Medicine | Admitting: Family Medicine

## 2017-12-11 DIAGNOSIS — Z1231 Encounter for screening mammogram for malignant neoplasm of breast: Secondary | ICD-10-CM

## 2017-12-17 ENCOUNTER — Ambulatory Visit (INDEPENDENT_AMBULATORY_CARE_PROVIDER_SITE_OTHER): Payer: PRIVATE HEALTH INSURANCE | Admitting: *Deleted

## 2017-12-17 DIAGNOSIS — J309 Allergic rhinitis, unspecified: Secondary | ICD-10-CM | POA: Diagnosis not present

## 2017-12-24 NOTE — Progress Notes (Signed)
VIALS EXP 12-25-18 

## 2017-12-25 ENCOUNTER — Ambulatory Visit (INDEPENDENT_AMBULATORY_CARE_PROVIDER_SITE_OTHER): Payer: PRIVATE HEALTH INSURANCE

## 2017-12-25 DIAGNOSIS — J309 Allergic rhinitis, unspecified: Secondary | ICD-10-CM

## 2017-12-26 DIAGNOSIS — J301 Allergic rhinitis due to pollen: Secondary | ICD-10-CM | POA: Diagnosis not present

## 2017-12-27 DIAGNOSIS — J301 Allergic rhinitis due to pollen: Secondary | ICD-10-CM | POA: Diagnosis not present

## 2017-12-31 ENCOUNTER — Ambulatory Visit (INDEPENDENT_AMBULATORY_CARE_PROVIDER_SITE_OTHER): Payer: PRIVATE HEALTH INSURANCE | Admitting: *Deleted

## 2017-12-31 DIAGNOSIS — J309 Allergic rhinitis, unspecified: Secondary | ICD-10-CM | POA: Diagnosis not present

## 2018-01-07 ENCOUNTER — Ambulatory Visit (INDEPENDENT_AMBULATORY_CARE_PROVIDER_SITE_OTHER): Payer: PRIVATE HEALTH INSURANCE | Admitting: *Deleted

## 2018-01-07 DIAGNOSIS — J309 Allergic rhinitis, unspecified: Secondary | ICD-10-CM

## 2018-01-21 ENCOUNTER — Ambulatory Visit: Payer: PRIVATE HEALTH INSURANCE | Admitting: Allergy and Immunology

## 2018-01-21 ENCOUNTER — Encounter: Payer: Self-pay | Admitting: Allergy and Immunology

## 2018-01-21 DIAGNOSIS — J3089 Other allergic rhinitis: Secondary | ICD-10-CM | POA: Diagnosis not present

## 2018-01-21 DIAGNOSIS — J0100 Acute maxillary sinusitis, unspecified: Secondary | ICD-10-CM | POA: Diagnosis not present

## 2018-01-21 DIAGNOSIS — L500 Allergic urticaria: Secondary | ICD-10-CM | POA: Diagnosis not present

## 2018-01-21 DIAGNOSIS — J019 Acute sinusitis, unspecified: Secondary | ICD-10-CM | POA: Insufficient documentation

## 2018-01-21 MED ORDER — PREDNISONE 1 MG PO TABS: 10.0000 mg | ORAL_TABLET | Freq: Every day | ORAL | Status: DC

## 2018-01-21 MED ORDER — AMOXICILLIN-POT CLAVULANATE 875-125 MG PO TABS: 1.0000 | ORAL_TABLET | Freq: Two times a day (BID) | ORAL | 0 refills | Status: DC

## 2018-01-21 MED ORDER — AZELASTINE HCL 0.1 % NA SOLN: NASAL | 5 refills | Status: DC

## 2018-01-21 NOTE — Progress Notes (Signed)
Follow-up Note  RE: Breanna Fuller MRN: [id number] DOB: 10-05-1976 Date of Office Visit: 01/21/2018  Primary care provider: Retia Passe, MD Referring provider: Retia Passe, MD  History of present illness: Breanna Fuller is a 42 y.o. female with allergic rhinoconjunctivitis on immunotherapy and history of urticaria presenting today for sick visit.  She was last seen in this clinic in August 2018.  She reports that over the past 2 weeks she has been experiencing sinus pressure/pain over her cheekbones, nasal congestion, thick postnasal drainage, and discolored mucus production.  She has experienced fevers and chills as well.  She currently does not take fluticasone nasal spray or azelastine nasal spray because of occasional mild epistaxis.  She is still progressing slowly through immunotherapy buildup injections due to the history of large local reactions.  She reports that she still occasionally develops mild episodes of hives.  No specific medication, food, skin care product, detergent, soap, or other environmental triggers have been identified.  Assessment and plan: Acute sinusitis  A prescription has been provided for Augmentin 875-125 mg, twice daily times 10 days.  Prednisone has been provided, 40 mg x3 days, 20 mg x1 day, 10 mg x1 day, then stop.  Restart azelastine nasal spray, 2 sprays per nostril 1-2 times daily for now.  Nasal saline lavage (NeilMed) has been recommended as needed and prior to medicated nasal sprays along with instructions for proper administration.  For thick post nasal drainage, add guaifenesin 1200 mg (Mucinex Maximum Strength)  twice daily as needed with adequate hydration as discussed.  Other allergic rhinitis  Continue appropriate allergen avoidance measures and immunotherapy as prescribed and as tolerated.  Azelastine nasal spray and nasal saline irrigation (as above).  Based on history of epistaxis, nasal steroid sprays will not be  used.  Urticaria  Continue H1/H2 receptor blockade, titrating to the lowest effective dose necessary to suppress urticaria.   Meds ordered this encounter  Medications  . amoxicillin-clavulanate (AUGMENTIN) 875-125 MG tablet    Sig: Take 1 tablet by mouth 2 (two) times daily.    Dispense:  20 tablet    Refill:  0  . azelastine (ASTELIN) 0.1 % nasal spray    Sig: 2 sprays in each nostril 1-2 times a day    Dispense:  30 mL    Refill:  5  . predniSONE (DELTASONE) tablet 10 mg    Physical examination: Blood pressure 110/70, pulse 72, temperature 97.6 F (36.4 C), temperature source Oral, resp. rate 16, SpO2 97 %.  General: Alert, interactive, in no acute distress. HEENT: TMs pearly gray, turbinates edematous with thick discharge, post-pharynx moderately erythematous. Neck: Supple without lymphadenopathy. Lungs: Clear to auscultation without wheezing, rhonchi or rales. CV: Normal S1, S2 without murmurs. Skin: Warm and dry, without lesions or rashes.  The following portions of the patient's history were reviewed and updated as appropriate: allergies, current medications, past family history, past medical history, past social history, past surgical history and problem list.  Allergies as of 01/21/2018      Reactions   Vancomycin Hives   Latex Itching   Monistat [miconazole] Swelling   Terazol [terconazole] Swelling   Wellbutrin [bupropion] Rash      Medication List        Accurate as of 01/21/18  5:14 PM. Always use your most recent med list.          amoxicillin-clavulanate 875-125 MG tablet Commonly known as:  AUGMENTIN Take 1 tablet by mouth 2 (two) times daily.  AUVI-Q 0.3 mg/0.3 mL Soaj injection Generic drug:  EPINEPHrine Inject 0.3 mg into the muscle once.   azelastine 0.1 % nasal spray Commonly known as:  ASTELIN 2 sprays in each nostril 1-2 times a day   cetirizine 10 MG tablet Commonly known as:  ZYRTEC Take 10 mg by mouth daily.   fluticasone 50  MCG/ACT nasal spray Commonly known as:  FLONASE Place 2 sprays into both nostrils daily as needed for allergies or rhinitis.   guaiFENesin 600 MG 12 hr tablet Commonly known as:  MUCINEX Take 600 mg by mouth as needed.   multivitamin tablet Take 1 tablet by mouth daily.   norethindrone 5 MG tablet Commonly known as:  AYGESTIN Take 5 mg by mouth daily.   oxymetazoline 0.05 % nasal spray Commonly known as:  AFRIN Place 1 spray into both nostrils as needed for congestion.   PATADAY 0.2 % Soln Generic drug:  Olopatadine HCl Apply to eye as needed.   pseudoephedrine 30 MG tablet Commonly known as:  SUDAFED Take 60 mg by mouth every 4 (four) hours as needed for congestion.   sertraline 100 MG tablet Commonly known as:  ZOLOFT Take 100 mg by mouth daily.   UNABLE TO FIND Med Name: immunotherapy   venlafaxine XR 75 MG 24 hr capsule Commonly known as:  EFFEXOR-XR 75 mg daily.       Allergies  Allergen Reactions  . Vancomycin Hives  . Latex Itching  . Monistat [Miconazole] Swelling  . Terazol [Terconazole] Swelling  . Wellbutrin [Bupropion] Rash   Review of systems: Review of systems negative except as noted in HPI / PMHx or noted below: Constitutional: Negative.  HENT: Negative.   Eyes: Negative.  Respiratory: Negative.   Cardiovascular: Negative.  Gastrointestinal: Negative.  Genitourinary: Negative.  Musculoskeletal: Negative.  Neurological: Negative.  Endo/Heme/Allergies: Negative.  Cutaneous: Negative.  History reviewed. No pertinent past medical history.  Family History  Problem Relation Age of Onset  . Allergic rhinitis Neg Hx   . Angioedema Neg Hx   . Asthma Neg Hx   . Atopy Neg Hx   . Eczema Neg Hx   . Immunodeficiency Neg Hx   . Urticaria Neg Hx     Social History   Socioeconomic History  . Marital status: Married    Spouse name: Not on file  . Number of children: Not on file  . Years of education: Not on file  . Highest education  level: Not on file  Occupational History  . Not on file  Social Needs  . Financial resource strain: Not on file  . Food insecurity:    Worry: Not on file    Inability: Not on file  . Transportation needs:    Medical: Not on file    Non-medical: Not on file  Tobacco Use  . Smoking status: Never Smoker  . Smokeless tobacco: Never Used  Substance and Sexual Activity  . Alcohol use: No    Alcohol/week: 0.0 oz  . Drug use: No  . Sexual activity: Not on file  Lifestyle  . Physical activity:    Days per week: Not on file    Minutes per session: Not on file  . Stress: Not on file  Relationships  . Social connections:    Talks on phone: Not on file    Gets together: Not on file    Attends religious service: Not on file    Active member of club or organization: Not on file  Attends meetings of clubs or organizations: Not on file    Relationship status: Not on file  . Intimate partner violence:    Fear of current or ex partner: Not on file    Emotionally abused: Not on file    Physically abused: Not on file    Forced sexual activity: Not on file  Other Topics Concern  . 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    I appreciate the opportunity to take part in Mayli's care. Please do not hesitate to contact me with questions.  Sincerely,   R. Jorene Guestarter Raeden Schippers, MD

## 2018-01-21 NOTE — Patient Instructions (Addendum)
Acute sinusitis  A prescription has been provided for Augmentin 875-125 mg, twice daily times 10 days.  Prednisone has been provided, 40 mg x3 days, 20 mg x1 day, 10 mg x1 day, then stop.  Restart azelastine nasal spray, 2 sprays per nostril 1-2 times daily for now.  Nasal saline lavage (NeilMed) has been recommended as needed and prior to medicated nasal sprays along with instructions for proper administration.  For thick post nasal drainage, add guaifenesin 1200 mg (Mucinex Maximum Strength)  twice daily as needed with adequate hydration as discussed.  Other allergic rhinitis  Continue appropriate allergen avoidance measures and immunotherapy as prescribed and as tolerated.  Azelastine nasal spray and nasal saline irrigation (as above).  Based on history of epistaxis, nasal steroid sprays will not be used.  Urticaria  Continue H1/H2 receptor blockade, titrating to the lowest effective dose necessary to suppress urticaria.   Return in about 6 months (around 07/24/2018), or if symptoms worsen or fail to improve.

## 2018-01-21 NOTE — Assessment & Plan Note (Signed)
   Continue appropriate allergen avoidance measures and immunotherapy as prescribed and as tolerated.  Azelastine nasal spray and nasal saline irrigation (as above).  Based on history of epistaxis, nasal steroid sprays will not be used.

## 2018-01-21 NOTE — Assessment & Plan Note (Signed)
   Continue H1/H2 receptor blockade, titrating to the lowest effective dose necessary to suppress urticaria. 

## 2018-01-21 NOTE — Assessment & Plan Note (Signed)
   A prescription has been provided for Augmentin 875-125 mg, twice daily times 10 days.  Prednisone has been provided, 40 mg x3 days, 20 mg x1 day, 10 mg x1 day, then stop.  Restart azelastine nasal spray, 2 sprays per nostril 1-2 times daily for now.  Nasal saline lavage (NeilMed) has been recommended as needed and prior to medicated nasal sprays along with instructions for proper administration.  For thick post nasal drainage, add guaifenesin 1200 mg (Mucinex Maximum Strength)  twice daily as needed with adequate hydration as discussed.

## 2018-01-28 ENCOUNTER — Ambulatory Visit (INDEPENDENT_AMBULATORY_CARE_PROVIDER_SITE_OTHER): Payer: PRIVATE HEALTH INSURANCE | Admitting: *Deleted

## 2018-01-28 DIAGNOSIS — J309 Allergic rhinitis, unspecified: Secondary | ICD-10-CM | POA: Diagnosis not present

## 2018-02-04 ENCOUNTER — Ambulatory Visit (INDEPENDENT_AMBULATORY_CARE_PROVIDER_SITE_OTHER): Payer: PRIVATE HEALTH INSURANCE | Admitting: *Deleted

## 2018-02-04 DIAGNOSIS — J309 Allergic rhinitis, unspecified: Secondary | ICD-10-CM

## 2018-02-11 ENCOUNTER — Telehealth: Payer: Self-pay | Admitting: *Deleted

## 2018-02-11 DIAGNOSIS — B999 Unspecified infectious disease: Secondary | ICD-10-CM

## 2018-02-11 NOTE — Telephone Encounter (Signed)
Patient called and stated she got another sinus infection started last week and over the weekend she felt worse. She called her insurance on call doctor and they prescribed Doxycycline. She just wanted you to be aware that she has had another sinus infection and it was worse than before. Is there something else she should be doing do help prevent these? Please advise

## 2018-02-11 NOTE — Telephone Encounter (Signed)
Immunocompetence will be assessed with labs. Please order the following labs: CBC with differential, IgG, IgA and IgM, tetanus IgG titers, and pneumococcal IgG titers.  If pre-vaccination titers are low, post-vaccination titers will be drawn to assess response.   Also, has she tried TogoXhance? If not, have her start that  - and she should continue azelastine and saline irrigation. If the labs are normal and problem persists despite adding Xhance, we may need to refer her to ENT to assess the issue further. Thanks.

## 2018-02-12 NOTE — Telephone Encounter (Signed)
Spoke to patient advised of plan and she will come in to get teaching done on HarringtonXhance. Once teaching is done we will send in script. Patient verbalized understanding. Labs ordered. Dr Nunzio CobbsBobbitt please provide diagnosis to associate labs.

## 2018-02-12 NOTE — Addendum Note (Signed)
Addended by: Bennye AlmMIRANDA, Esai Stecklein on: 02/12/2018 03:49 PM   Modules accepted: Orders

## 2018-02-12 NOTE — Telephone Encounter (Signed)
Recurrent infections. Recurrent sinusitis.

## 2018-02-12 NOTE — Telephone Encounter (Signed)
Orders signed.

## 2018-02-12 NOTE — Addendum Note (Signed)
Addended by: Mariane DuvalVERNON, Willow Shidler L on: 02/12/2018 04:20 PM   Modules accepted: Orders

## 2018-02-13 ENCOUNTER — Other Ambulatory Visit: Payer: Self-pay | Admitting: *Deleted

## 2018-02-13 MED ORDER — FLUTICASONE PROPIONATE 93 MCG/ACT NA EXHU: 2.0000 | INHALANT_SUSPENSION | Freq: Two times a day (BID) | NASAL | 5 refills | Status: DC

## 2018-02-17 ENCOUNTER — Ambulatory Visit (INDEPENDENT_AMBULATORY_CARE_PROVIDER_SITE_OTHER): Payer: PRIVATE HEALTH INSURANCE | Admitting: *Deleted

## 2018-02-17 ENCOUNTER — Telehealth: Payer: Self-pay | Admitting: *Deleted

## 2018-02-17 DIAGNOSIS — J309 Allergic rhinitis, unspecified: Secondary | ICD-10-CM | POA: Diagnosis not present

## 2018-02-17 DIAGNOSIS — J0100 Acute maxillary sinusitis, unspecified: Secondary | ICD-10-CM

## 2018-02-17 MED ORDER — AMOXICILLIN-POT CLAVULANATE 875-125 MG PO TABS: 1.0000 | ORAL_TABLET | Freq: Two times a day (BID) | ORAL | 0 refills | Status: AC

## 2018-02-17 NOTE — Telephone Encounter (Signed)
We could put her on another round of Augmentin 875-125 mg, 1 po bid x 14 days. Please tell her that her immunoglobulin levels are normal and that we are awaiting strept pneumo titers, if normal then ENT evaluation may be warranted.

## 2018-02-17 NOTE — Telephone Encounter (Signed)
Informed patient of results and new antibiotic. Script sent into pharmacy.

## 2018-02-17 NOTE — Telephone Encounter (Signed)
Patient has 2 days left of her Doxycycline and still has lots of congestion and get mucus. She doesn't think the Doxycycline is helping. Can we switch her to something different?

## 2018-02-19 LAB — STREP PNEUMONIAE 23 SEROTYPES IGG
Pneumo Ab Type 1*: 2 ug/mL (ref >1.3)
Pneumo Ab Type 12 (12F)*: 0.1 ug/mL — ABNORMAL LOW (ref >1.3)
Pneumo Ab Type 14*: 0.2 ug/mL — ABNORMAL LOW (ref >1.3)
Pneumo Ab Type 17 (17F)*: 1.2 ug/mL — ABNORMAL LOW (ref >1.3)
Pneumo Ab Type 19 (19F)*: 0.7 ug/mL — ABNORMAL LOW (ref >1.3)
Pneumo Ab Type 2*: 0.1 ug/mL — ABNORMAL LOW (ref >1.3)
Pneumo Ab Type 20*: 1.3 ug/mL — ABNORMAL LOW (ref >1.3)
Pneumo Ab Type 22 (22F)*: <0.1 ug/mL — ABNORMAL LOW (ref >1.3)
Pneumo Ab Type 23 (23F)*: 0.6 ug/mL — ABNORMAL LOW (ref >1.3)
Pneumo Ab Type 26 (6B)*: <0.1 ug/mL — ABNORMAL LOW (ref >1.3)
Pneumo Ab Type 3*: 0.3 ug/mL — ABNORMAL LOW (ref >1.3)
Pneumo Ab Type 34 (10A)*: 1.3 ug/mL — ABNORMAL LOW (ref >1.3)
Pneumo Ab Type 4*: 0.1 ug/mL — ABNORMAL LOW (ref >1.3)
Pneumo Ab Type 43 (11A)*: 8 ug/mL (ref >1.3)
Pneumo Ab Type 5*: <0.2 ug/mL — ABNORMAL LOW (ref >1.3)
Pneumo Ab Type 51 (7F)*: <0.1 ug/mL — ABNORMAL LOW (ref >1.3)
Pneumo Ab Type 54 (15B)*: 0.8 ug/mL — ABNORMAL LOW (ref >1.3)
Pneumo Ab Type 56 (18C)*: 0.5 ug/mL — ABNORMAL LOW (ref >1.3)
Pneumo Ab Type 57 (19A)*: 3.5 ug/mL (ref >1.3)
Pneumo Ab Type 68 (9V)*: <0.1 ug/mL — ABNORMAL LOW (ref >1.3)
Pneumo Ab Type 70 (33F)*: <0.1 ug/mL — ABNORMAL LOW (ref >1.3)
Pneumo Ab Type 8*: 0.2 ug/mL — ABNORMAL LOW (ref >1.3)
Pneumo Ab Type 9 (9N)*: <0.1 ug/mL — ABNORMAL LOW (ref >1.3)

## 2018-02-19 LAB — CBC WITH DIFFERENTIAL
Basophils Absolute: 0 10*3/uL (ref 0.0–0.2)
Basos: 0 %
EOS (ABSOLUTE): 0.4 10*3/uL (ref 0.0–0.4)
Eos: 5 %
Hematocrit: 39.7 % (ref 34.0–46.6)
Hemoglobin: 13.3 g/dL (ref 11.1–15.9)
Immature Grans (Abs): 0 10*3/uL (ref 0.0–0.1)
Immature Granulocytes: 0 %
Lymphocytes Absolute: 2.6 10*3/uL (ref 0.7–3.1)
Lymphs: 35 %
MCH: 29.8 pg (ref 26.6–33.0)
MCHC: 33.5 g/dL (ref 31.5–35.7)
MCV: 89 fL (ref 79–97)
Monocytes Absolute: 0.6 10*3/uL (ref 0.1–0.9)
Monocytes: 9 %
Neutrophils Absolute: 3.8 10*3/uL (ref 1.4–7.0)
Neutrophils: 51 %
RBC: 4.46 x10E6/uL (ref 3.77–5.28)
RDW: 13.4 % (ref 12.3–15.4)
WBC: 7.4 10*3/uL (ref 3.4–10.8)

## 2018-02-19 LAB — IGG, IGA, IGM
IgA/Immunoglobulin A, Serum: 333 mg/dL (ref 87–352)
IgG (Immunoglobin G), Serum: 990 mg/dL (ref 700–1600)
IgM (Immunoglobulin M), Srm: 91 mg/dL (ref 26–217)

## 2018-02-19 LAB — TETANUS ANTIBODY, IGG: Tetanus Ab, IgG: 2.5 IU/mL (ref <0.10)

## 2018-02-20 ENCOUNTER — Other Ambulatory Visit: Payer: Self-pay | Admitting: Family Medicine

## 2018-02-21 ENCOUNTER — Other Ambulatory Visit: Payer: Self-pay | Admitting: Family Medicine

## 2018-02-21 ENCOUNTER — Ambulatory Visit
Admission: RE | Admit: 2018-02-21 | Discharge: 2018-02-21 | Disposition: A | Discharge status: Home / Self Care | Payer: PRIVATE HEALTH INSURANCE | Source: Ambulatory Visit | Attending: Family Medicine | Admitting: Family Medicine

## 2018-02-21 DIAGNOSIS — R52 Pain, unspecified: Secondary | ICD-10-CM

## 2018-02-25 ENCOUNTER — Other Ambulatory Visit: Payer: Self-pay | Admitting: Family Medicine

## 2018-02-25 ENCOUNTER — Ambulatory Visit (INDEPENDENT_AMBULATORY_CARE_PROVIDER_SITE_OTHER): Payer: PRIVATE HEALTH INSURANCE | Admitting: *Deleted

## 2018-02-25 DIAGNOSIS — J309 Allergic rhinitis, unspecified: Secondary | ICD-10-CM

## 2018-02-25 DIAGNOSIS — G8929 Other chronic pain: Secondary | ICD-10-CM

## 2018-02-25 DIAGNOSIS — M25512 Pain in left shoulder: Principal | ICD-10-CM

## 2018-03-04 ENCOUNTER — Ambulatory Visit (INDEPENDENT_AMBULATORY_CARE_PROVIDER_SITE_OTHER): Payer: PRIVATE HEALTH INSURANCE | Admitting: *Deleted

## 2018-03-04 DIAGNOSIS — Z23 Encounter for immunization: Secondary | ICD-10-CM | POA: Diagnosis not present

## 2018-03-05 ENCOUNTER — Telehealth: Payer: Self-pay | Admitting: *Deleted

## 2018-03-05 NOTE — Telephone Encounter (Addendum)
Patient called in regards to having swelling, redness and pain around site, also patient's lymph nodes are swollen she received Pnuemovax 03/04/18. Advised per Dr Delorse Lek to take NSAIDs on schedule, apply ice and move the arm; also advised that lymph nodes adqeuactely responding. Patient verbalized understanding. Also patient did want to know if she can come in to get allergy injection advised if the swelling is still there skip this week.

## 2018-03-06 ENCOUNTER — Ambulatory Visit
Admission: RE | Admit: 2018-03-06 | Discharge: 2018-03-06 | Disposition: A | Discharge status: Home / Self Care | Payer: PRIVATE HEALTH INSURANCE | Source: Ambulatory Visit | Attending: Family Medicine | Admitting: Family Medicine

## 2018-03-06 DIAGNOSIS — G8929 Other chronic pain: Secondary | ICD-10-CM

## 2018-03-06 DIAGNOSIS — M25512 Pain in left shoulder: Principal | ICD-10-CM

## 2018-03-06 MED ORDER — PNEUMOCOCCAL VAC POLYVALENT 25 MCG/0.5ML IJ INJ
0.5000 mL | INJECTION | Freq: Once | INTRAMUSCULAR | Status: AC
  Administered 2018-03-06: 0.5 mL via INTRAMUSCULAR

## 2018-03-12 ENCOUNTER — Ambulatory Visit (INDEPENDENT_AMBULATORY_CARE_PROVIDER_SITE_OTHER): Payer: PRIVATE HEALTH INSURANCE | Admitting: *Deleted

## 2018-03-12 DIAGNOSIS — J309 Allergic rhinitis, unspecified: Secondary | ICD-10-CM | POA: Diagnosis not present

## 2018-03-18 ENCOUNTER — Ambulatory Visit (INDEPENDENT_AMBULATORY_CARE_PROVIDER_SITE_OTHER): Payer: PRIVATE HEALTH INSURANCE | Admitting: *Deleted

## 2018-03-18 DIAGNOSIS — J309 Allergic rhinitis, unspecified: Secondary | ICD-10-CM

## 2018-03-25 ENCOUNTER — Ambulatory Visit (INDEPENDENT_AMBULATORY_CARE_PROVIDER_SITE_OTHER): Payer: PRIVATE HEALTH INSURANCE | Admitting: *Deleted

## 2018-03-25 DIAGNOSIS — J309 Allergic rhinitis, unspecified: Secondary | ICD-10-CM

## 2018-04-02 ENCOUNTER — Ambulatory Visit (INDEPENDENT_AMBULATORY_CARE_PROVIDER_SITE_OTHER): Payer: PRIVATE HEALTH INSURANCE

## 2018-04-02 DIAGNOSIS — J309 Allergic rhinitis, unspecified: Secondary | ICD-10-CM | POA: Diagnosis not present

## 2018-04-03 ENCOUNTER — Telehealth: Payer: Self-pay

## 2018-04-03 NOTE — Progress Notes (Signed)
Drop back by 3 doses and hold there for the next 3 rounds, then if no problem may build back up to previous on schedule A. Contact me with questions. Thanks. 

## 2018-04-03 NOTE — Telephone Encounter (Signed)
I have copied/pasted this into the patient's most recent immunotherapy encounter and flagged all three injections and added note to see progress note attached to that encounter.

## 2018-04-03 NOTE — Telephone Encounter (Signed)
Patient had immunotherapy injections yesterday. States today the Right & LL are at 5+ reaction and very hot to touch. The LU is at 4+ reaction and very hot. She states that she feels fine though. Please advise and thank you.  TREE-DM            Expiration Date   12/25/18          Vial Concentration   1:100 (Red)          Schedule   A          Schedule Comments  1 time weekly          Dose (mL)   0.2          Location   LLA          Reaction   0          Delayed Reaction   +3 last inj.          Given by   ca          Additional Allergens  Yes          Allergen (VERIFY CORRECT VIAL)  Allergen  MOLD-C-CR          Expiration Date   12/25/18          Vial Concentration   1:100 (Red)          Schedule   A          Schedule Comments  WEEKLY          Dose (mL)   0.25          Location   LUA          Given by   ca          Additional Allergens  Yes          Allergen (VERIFY CORRECT VIAL)  Allergen  GRASS-WEED          Expiration Date  12/25/18          Vial Concentration  1:10,000 (Gold/ Yellow)          Schedule  A          Schedule Comments  1 time weekly          Dose (mL)  0.3          Location  [facility]          Reaction  +1          Delayed Reaction  +3 delayed          Given by  ca

## 2018-04-03 NOTE — Telephone Encounter (Signed)
Drop back by 3 doses and hold there for the next 3 rounds, then if no problem may build back up to previous on schedule A. Contact me with questions. Thanks.

## 2018-04-14 ENCOUNTER — Other Ambulatory Visit: Payer: Self-pay

## 2018-04-14 ENCOUNTER — Telehealth: Payer: Self-pay | Admitting: Allergy & Immunology

## 2018-04-14 MED ORDER — PREDNISONE 10 MG PO TABS: ORAL_TABLET | ORAL | 0 refills | Status: DC

## 2018-04-14 MED ORDER — AMOXICILLIN-POT CLAVULANATE 875-125 MG PO TABS: 1.0000 | ORAL_TABLET | Freq: Every day | ORAL | 0 refills | Status: AC

## 2018-04-14 NOTE — Telephone Encounter (Signed)
Please call in prednisone, 40 mg x3 days, 20 mg x1 day, 10 mg x1 day, then stop. Please find out if the patient has been febrile or had discolored mucus, if so, call in Augmentin 875-125 mg, 1 p.o. twice daily x10 days. Please encourage her to use nasal saline lavage followed by her medicated nasal sprays as prescribed. Thanks.

## 2018-04-14 NOTE — Telephone Encounter (Signed)
Patient has had a recent sinus infection Missed allergy injection last week Patient feels she still has the sinus infection or starting a new one  - does she need to be seen, or can a medication be called in   Patient uses [facility] on [address] in [place]

## 2018-04-14 NOTE — Telephone Encounter (Signed)
Spoke to pt and she states her mucus is a green color, I sent a Rx for Prednisone 10 mg: 40 mg x 3 days, 20 mg x 1 day, 10 mg x 1 day then stop #15 with NR.  Augmentin 875-125 mg 1 po x 10 days  #10 NR

## 2018-04-14 NOTE — Telephone Encounter (Signed)
Dr. Bobbitt can you please advise? Thank you.  

## 2018-04-14 NOTE — Telephone Encounter (Signed)
Patient called back and would like a response back before lunch, because [place] in [place] and [place] in [place] until lunch. She needs to know if she needs to be seen today, before [place].

## 2018-04-15 NOTE — Telephone Encounter (Signed)
Patient called to verify prescription for prednisone reviewed with patient dose.

## 2018-04-21 ENCOUNTER — Ambulatory Visit (INDEPENDENT_AMBULATORY_CARE_PROVIDER_SITE_OTHER): Payer: PRIVATE HEALTH INSURANCE | Admitting: *Deleted

## 2018-04-21 DIAGNOSIS — J309 Allergic rhinitis, unspecified: Secondary | ICD-10-CM

## 2018-04-29 ENCOUNTER — Ambulatory Visit (INDEPENDENT_AMBULATORY_CARE_PROVIDER_SITE_OTHER): Payer: PRIVATE HEALTH INSURANCE | Admitting: *Deleted

## 2018-04-29 DIAGNOSIS — J309 Allergic rhinitis, unspecified: Secondary | ICD-10-CM | POA: Diagnosis not present

## 2018-04-29 DIAGNOSIS — B999 Unspecified infectious disease: Secondary | ICD-10-CM

## 2018-05-07 ENCOUNTER — Ambulatory Visit (INDEPENDENT_AMBULATORY_CARE_PROVIDER_SITE_OTHER): Payer: PRIVATE HEALTH INSURANCE | Admitting: *Deleted

## 2018-05-07 DIAGNOSIS — J309 Allergic rhinitis, unspecified: Secondary | ICD-10-CM

## 2018-05-07 LAB — STREP PNEUMONIAE 23 SEROTYPES IGG
Pneumo Ab Type 1*: 18.2 ug/mL (ref >1.3)
Pneumo Ab Type 12 (12F)*: 0.7 ug/mL — ABNORMAL LOW (ref >1.3)
Pneumo Ab Type 14*: 1.4 ug/mL (ref >1.3)
Pneumo Ab Type 17 (17F)*: 19.3 ug/mL (ref >1.3)
Pneumo Ab Type 19 (19F)*: 2.3 ug/mL (ref >1.3)
Pneumo Ab Type 2*: 7.8 ug/mL (ref >1.3)
Pneumo Ab Type 20*: 23.8 ug/mL (ref >1.3)
Pneumo Ab Type 22 (22F)*: 0.8 ug/mL — ABNORMAL LOW (ref >1.3)
Pneumo Ab Type 23 (23F)*: 4.8 ug/mL (ref >1.3)
Pneumo Ab Type 26 (6B)*: 1 ug/mL — ABNORMAL LOW (ref >1.3)
Pneumo Ab Type 3*: 0.7 ug/mL — ABNORMAL LOW (ref >1.3)
Pneumo Ab Type 34 (10A)*: 23.1 ug/mL (ref >1.3)
Pneumo Ab Type 4*: 0.5 ug/mL — ABNORMAL LOW (ref >1.3)
Pneumo Ab Type 43 (11A)*: 11.2 ug/mL (ref >1.3)
Pneumo Ab Type 5*: 53.4 ug/mL (ref >1.3)
Pneumo Ab Type 51 (7F)*: 1.9 ug/mL (ref >1.3)
Pneumo Ab Type 54 (15B)*: 8.1 ug/mL (ref >1.3)
Pneumo Ab Type 56 (18C)*: 3.1 ug/mL (ref >1.3)
Pneumo Ab Type 57 (19A)*: 5.6 ug/mL (ref >1.3)
Pneumo Ab Type 68 (9V)*: 0.8 ug/mL — ABNORMAL LOW (ref >1.3)
Pneumo Ab Type 70 (33F)*: 1.4 ug/mL (ref >1.3)
Pneumo Ab Type 8*: 17 ug/mL (ref >1.3)
Pneumo Ab Type 9 (9N)*: 0.7 ug/mL — ABNORMAL LOW (ref >1.3)

## 2018-05-13 ENCOUNTER — Ambulatory Visit (INDEPENDENT_AMBULATORY_CARE_PROVIDER_SITE_OTHER): Payer: PRIVATE HEALTH INSURANCE | Admitting: *Deleted

## 2018-05-13 DIAGNOSIS — J309 Allergic rhinitis, unspecified: Secondary | ICD-10-CM

## 2018-05-27 ENCOUNTER — Ambulatory Visit (INDEPENDENT_AMBULATORY_CARE_PROVIDER_SITE_OTHER): Payer: PRIVATE HEALTH INSURANCE | Admitting: *Deleted

## 2018-05-27 DIAGNOSIS — J309 Allergic rhinitis, unspecified: Secondary | ICD-10-CM

## 2018-06-04 ENCOUNTER — Ambulatory Visit (INDEPENDENT_AMBULATORY_CARE_PROVIDER_SITE_OTHER): Payer: PRIVATE HEALTH INSURANCE | Admitting: *Deleted

## 2018-06-04 DIAGNOSIS — J309 Allergic rhinitis, unspecified: Secondary | ICD-10-CM | POA: Diagnosis not present

## 2018-06-10 ENCOUNTER — Ambulatory Visit (INDEPENDENT_AMBULATORY_CARE_PROVIDER_SITE_OTHER): Payer: PRIVATE HEALTH INSURANCE

## 2018-06-10 DIAGNOSIS — J309 Allergic rhinitis, unspecified: Secondary | ICD-10-CM

## 2018-06-17 ENCOUNTER — Ambulatory Visit (INDEPENDENT_AMBULATORY_CARE_PROVIDER_SITE_OTHER): Payer: PRIVATE HEALTH INSURANCE | Admitting: *Deleted

## 2018-06-17 DIAGNOSIS — J309 Allergic rhinitis, unspecified: Secondary | ICD-10-CM | POA: Diagnosis not present

## 2018-06-24 ENCOUNTER — Ambulatory Visit (INDEPENDENT_AMBULATORY_CARE_PROVIDER_SITE_OTHER): Payer: PRIVATE HEALTH INSURANCE | Admitting: *Deleted

## 2018-06-24 DIAGNOSIS — J309 Allergic rhinitis, unspecified: Secondary | ICD-10-CM

## 2018-06-25 DIAGNOSIS — J3089 Other allergic rhinitis: Secondary | ICD-10-CM | POA: Diagnosis not present

## 2018-06-25 NOTE — Progress Notes (Signed)
VIALS EXP 06-26-19 

## 2018-07-02 ENCOUNTER — Ambulatory Visit (INDEPENDENT_AMBULATORY_CARE_PROVIDER_SITE_OTHER): Payer: PRIVATE HEALTH INSURANCE

## 2018-07-02 DIAGNOSIS — J309 Allergic rhinitis, unspecified: Secondary | ICD-10-CM

## 2018-07-10 ENCOUNTER — Ambulatory Visit (INDEPENDENT_AMBULATORY_CARE_PROVIDER_SITE_OTHER): Payer: PRIVATE HEALTH INSURANCE | Admitting: *Deleted

## 2018-07-10 DIAGNOSIS — J309 Allergic rhinitis, unspecified: Secondary | ICD-10-CM

## 2018-07-16 ENCOUNTER — Ambulatory Visit (INDEPENDENT_AMBULATORY_CARE_PROVIDER_SITE_OTHER): Payer: PRIVATE HEALTH INSURANCE | Admitting: *Deleted

## 2018-07-16 DIAGNOSIS — J309 Allergic rhinitis, unspecified: Secondary | ICD-10-CM

## 2018-07-22 ENCOUNTER — Ambulatory Visit (INDEPENDENT_AMBULATORY_CARE_PROVIDER_SITE_OTHER): Payer: PRIVATE HEALTH INSURANCE | Admitting: *Deleted

## 2018-07-22 DIAGNOSIS — J309 Allergic rhinitis, unspecified: Secondary | ICD-10-CM | POA: Diagnosis not present

## 2018-07-30 ENCOUNTER — Ambulatory Visit (INDEPENDENT_AMBULATORY_CARE_PROVIDER_SITE_OTHER): Payer: PRIVATE HEALTH INSURANCE

## 2018-07-30 DIAGNOSIS — J309 Allergic rhinitis, unspecified: Secondary | ICD-10-CM

## 2018-08-04 ENCOUNTER — Ambulatory Visit (INDEPENDENT_AMBULATORY_CARE_PROVIDER_SITE_OTHER): Payer: PRIVATE HEALTH INSURANCE

## 2018-08-04 ENCOUNTER — Telehealth: Payer: Self-pay

## 2018-08-04 DIAGNOSIS — J309 Allergic rhinitis, unspecified: Secondary | ICD-10-CM

## 2018-08-04 NOTE — Telephone Encounter (Signed)
error 

## 2018-08-13 ENCOUNTER — Ambulatory Visit (INDEPENDENT_AMBULATORY_CARE_PROVIDER_SITE_OTHER): Payer: PRIVATE HEALTH INSURANCE | Admitting: *Deleted

## 2018-08-13 DIAGNOSIS — J309 Allergic rhinitis, unspecified: Secondary | ICD-10-CM | POA: Diagnosis not present

## 2018-08-15 ENCOUNTER — Telehealth: Payer: Self-pay | Admitting: *Deleted

## 2018-08-15 NOTE — Telephone Encounter (Signed)
Patient continues to struggle with her Tree-Dmite & Mold-Cat-Cr vial to get higher than Red 1:100-0.25. Is it ok to freeze patient at this dose? Please advise.

## 2018-08-18 NOTE — Telephone Encounter (Signed)
Yes, we can freeze her at that dose and call it her maintenance. Thanks.

## 2018-08-19 ENCOUNTER — Ambulatory Visit (INDEPENDENT_AMBULATORY_CARE_PROVIDER_SITE_OTHER): Payer: PRIVATE HEALTH INSURANCE | Admitting: *Deleted

## 2018-08-19 DIAGNOSIS — J309 Allergic rhinitis, unspecified: Secondary | ICD-10-CM | POA: Diagnosis not present

## 2018-08-27 ENCOUNTER — Ambulatory Visit (INDEPENDENT_AMBULATORY_CARE_PROVIDER_SITE_OTHER): Payer: PRIVATE HEALTH INSURANCE | Admitting: *Deleted

## 2018-08-27 DIAGNOSIS — J309 Allergic rhinitis, unspecified: Secondary | ICD-10-CM | POA: Diagnosis not present

## 2018-09-03 ENCOUNTER — Ambulatory Visit (INDEPENDENT_AMBULATORY_CARE_PROVIDER_SITE_OTHER): Payer: PRIVATE HEALTH INSURANCE | Admitting: *Deleted

## 2018-09-03 DIAGNOSIS — J309 Allergic rhinitis, unspecified: Secondary | ICD-10-CM | POA: Diagnosis not present

## 2018-09-11 ENCOUNTER — Ambulatory Visit (INDEPENDENT_AMBULATORY_CARE_PROVIDER_SITE_OTHER): Payer: PRIVATE HEALTH INSURANCE | Admitting: *Deleted

## 2018-09-11 DIAGNOSIS — J309 Allergic rhinitis, unspecified: Secondary | ICD-10-CM | POA: Diagnosis not present

## 2018-09-16 ENCOUNTER — Ambulatory Visit (INDEPENDENT_AMBULATORY_CARE_PROVIDER_SITE_OTHER): Payer: PRIVATE HEALTH INSURANCE | Admitting: *Deleted

## 2018-09-16 DIAGNOSIS — J309 Allergic rhinitis, unspecified: Secondary | ICD-10-CM

## 2018-09-24 ENCOUNTER — Ambulatory Visit (INDEPENDENT_AMBULATORY_CARE_PROVIDER_SITE_OTHER): Payer: PRIVATE HEALTH INSURANCE | Admitting: *Deleted

## 2018-09-24 DIAGNOSIS — J309 Allergic rhinitis, unspecified: Secondary | ICD-10-CM

## 2018-09-30 ENCOUNTER — Ambulatory Visit (INDEPENDENT_AMBULATORY_CARE_PROVIDER_SITE_OTHER): Payer: PRIVATE HEALTH INSURANCE | Admitting: *Deleted

## 2018-09-30 DIAGNOSIS — J309 Allergic rhinitis, unspecified: Secondary | ICD-10-CM

## 2018-10-08 ENCOUNTER — Ambulatory Visit (INDEPENDENT_AMBULATORY_CARE_PROVIDER_SITE_OTHER): Payer: PRIVATE HEALTH INSURANCE | Admitting: *Deleted

## 2018-10-08 DIAGNOSIS — J309 Allergic rhinitis, unspecified: Secondary | ICD-10-CM | POA: Diagnosis not present

## 2018-10-16 ENCOUNTER — Ambulatory Visit (INDEPENDENT_AMBULATORY_CARE_PROVIDER_SITE_OTHER): Payer: PRIVATE HEALTH INSURANCE | Admitting: *Deleted

## 2018-10-16 DIAGNOSIS — J309 Allergic rhinitis, unspecified: Secondary | ICD-10-CM

## 2018-10-29 HISTORY — PX: VAGINAL HYSTERECTOMY: SHX2639

## 2018-10-30 ENCOUNTER — Ambulatory Visit (INDEPENDENT_AMBULATORY_CARE_PROVIDER_SITE_OTHER): Payer: PRIVATE HEALTH INSURANCE | Admitting: *Deleted

## 2018-10-30 DIAGNOSIS — J309 Allergic rhinitis, unspecified: Secondary | ICD-10-CM

## 2018-11-05 ENCOUNTER — Ambulatory Visit (INDEPENDENT_AMBULATORY_CARE_PROVIDER_SITE_OTHER): Payer: PRIVATE HEALTH INSURANCE | Admitting: *Deleted

## 2018-11-05 DIAGNOSIS — J309 Allergic rhinitis, unspecified: Secondary | ICD-10-CM | POA: Diagnosis not present

## 2018-11-06 ENCOUNTER — Ambulatory Visit
Admission: RE | Admit: 2018-11-06 | Discharge: 2018-11-06 | Disposition: A | Discharge status: Home / Self Care | Payer: PRIVATE HEALTH INSURANCE | Source: Ambulatory Visit | Attending: Family Medicine | Admitting: Family Medicine

## 2018-11-06 ENCOUNTER — Other Ambulatory Visit: Payer: Self-pay | Admitting: Family Medicine

## 2018-11-06 DIAGNOSIS — M542 Cervicalgia: Secondary | ICD-10-CM

## 2018-11-10 ENCOUNTER — Other Ambulatory Visit: Payer: Self-pay | Admitting: Family Medicine

## 2018-11-10 DIAGNOSIS — Z1231 Encounter for screening mammogram for malignant neoplasm of breast: Secondary | ICD-10-CM

## 2018-11-11 ENCOUNTER — Telehealth: Payer: Self-pay | Admitting: *Deleted

## 2018-11-11 NOTE — Telephone Encounter (Signed)
Patient wanted to know if she get a TB skin test today can she still come in for her allergy injection tomorrow. I spoke with Dr. Dellis AnesGallagher and he stated it would be fine. Patient informed.

## 2018-11-13 ENCOUNTER — Ambulatory Visit (INDEPENDENT_AMBULATORY_CARE_PROVIDER_SITE_OTHER): Payer: PRIVATE HEALTH INSURANCE | Admitting: *Deleted

## 2018-11-13 DIAGNOSIS — J309 Allergic rhinitis, unspecified: Secondary | ICD-10-CM

## 2018-11-19 ENCOUNTER — Ambulatory Visit (INDEPENDENT_AMBULATORY_CARE_PROVIDER_SITE_OTHER): Payer: PRIVATE HEALTH INSURANCE

## 2018-11-19 DIAGNOSIS — J309 Allergic rhinitis, unspecified: Secondary | ICD-10-CM | POA: Diagnosis not present

## 2018-12-02 NOTE — Progress Notes (Signed)
Exp 12/04/19 

## 2018-12-03 ENCOUNTER — Ambulatory Visit (INDEPENDENT_AMBULATORY_CARE_PROVIDER_SITE_OTHER): Payer: PRIVATE HEALTH INSURANCE | Admitting: *Deleted

## 2018-12-03 DIAGNOSIS — J309 Allergic rhinitis, unspecified: Secondary | ICD-10-CM

## 2018-12-04 DIAGNOSIS — J301 Allergic rhinitis due to pollen: Secondary | ICD-10-CM

## 2018-12-09 ENCOUNTER — Ambulatory Visit (INDEPENDENT_AMBULATORY_CARE_PROVIDER_SITE_OTHER): Payer: PRIVATE HEALTH INSURANCE | Admitting: *Deleted

## 2018-12-09 DIAGNOSIS — J309 Allergic rhinitis, unspecified: Secondary | ICD-10-CM

## 2018-12-15 DIAGNOSIS — J3089 Other allergic rhinitis: Secondary | ICD-10-CM | POA: Diagnosis not present

## 2018-12-17 ENCOUNTER — Ambulatory Visit (INDEPENDENT_AMBULATORY_CARE_PROVIDER_SITE_OTHER): Payer: PRIVATE HEALTH INSURANCE | Admitting: *Deleted

## 2018-12-17 DIAGNOSIS — J309 Allergic rhinitis, unspecified: Secondary | ICD-10-CM

## 2018-12-17 NOTE — Progress Notes (Signed)
VIALS EXP 12-18-2019 

## 2018-12-19 ENCOUNTER — Ambulatory Visit: Payer: PRIVATE HEALTH INSURANCE

## 2018-12-24 ENCOUNTER — Ambulatory Visit (INDEPENDENT_AMBULATORY_CARE_PROVIDER_SITE_OTHER): Payer: PRIVATE HEALTH INSURANCE | Admitting: *Deleted

## 2018-12-24 DIAGNOSIS — J309 Allergic rhinitis, unspecified: Secondary | ICD-10-CM | POA: Diagnosis not present

## 2018-12-31 ENCOUNTER — Ambulatory Visit (INDEPENDENT_AMBULATORY_CARE_PROVIDER_SITE_OTHER): Payer: PRIVATE HEALTH INSURANCE | Admitting: *Deleted

## 2018-12-31 DIAGNOSIS — J309 Allergic rhinitis, unspecified: Secondary | ICD-10-CM

## 2019-01-07 ENCOUNTER — Ambulatory Visit (INDEPENDENT_AMBULATORY_CARE_PROVIDER_SITE_OTHER): Payer: PRIVATE HEALTH INSURANCE | Admitting: *Deleted

## 2019-01-07 DIAGNOSIS — J309 Allergic rhinitis, unspecified: Secondary | ICD-10-CM

## 2019-01-13 ENCOUNTER — Ambulatory Visit (INDEPENDENT_AMBULATORY_CARE_PROVIDER_SITE_OTHER): Payer: PRIVATE HEALTH INSURANCE | Admitting: *Deleted

## 2019-01-13 DIAGNOSIS — J309 Allergic rhinitis, unspecified: Secondary | ICD-10-CM | POA: Diagnosis not present

## 2019-01-20 ENCOUNTER — Ambulatory Visit (INDEPENDENT_AMBULATORY_CARE_PROVIDER_SITE_OTHER): Payer: PRIVATE HEALTH INSURANCE | Admitting: *Deleted

## 2019-01-20 DIAGNOSIS — J309 Allergic rhinitis, unspecified: Secondary | ICD-10-CM

## 2019-01-30 ENCOUNTER — Ambulatory Visit: Payer: PRIVATE HEALTH INSURANCE

## 2019-02-03 ENCOUNTER — Ambulatory Visit (INDEPENDENT_AMBULATORY_CARE_PROVIDER_SITE_OTHER): Payer: PRIVATE HEALTH INSURANCE | Admitting: *Deleted

## 2019-02-03 DIAGNOSIS — J309 Allergic rhinitis, unspecified: Secondary | ICD-10-CM | POA: Diagnosis not present

## 2019-02-17 ENCOUNTER — Ambulatory Visit (INDEPENDENT_AMBULATORY_CARE_PROVIDER_SITE_OTHER): Payer: PRIVATE HEALTH INSURANCE | Admitting: *Deleted

## 2019-02-17 DIAGNOSIS — J309 Allergic rhinitis, unspecified: Secondary | ICD-10-CM | POA: Diagnosis not present

## 2019-02-24 ENCOUNTER — Ambulatory Visit (INDEPENDENT_AMBULATORY_CARE_PROVIDER_SITE_OTHER): Payer: PRIVATE HEALTH INSURANCE | Admitting: *Deleted

## 2019-02-24 DIAGNOSIS — J309 Allergic rhinitis, unspecified: Secondary | ICD-10-CM | POA: Diagnosis not present

## 2019-03-02 ENCOUNTER — Ambulatory Visit (INDEPENDENT_AMBULATORY_CARE_PROVIDER_SITE_OTHER): Payer: PRIVATE HEALTH INSURANCE

## 2019-03-02 DIAGNOSIS — J309 Allergic rhinitis, unspecified: Secondary | ICD-10-CM | POA: Diagnosis not present

## 2019-03-18 ENCOUNTER — Other Ambulatory Visit: Payer: Self-pay

## 2019-03-18 ENCOUNTER — Ambulatory Visit (INDEPENDENT_AMBULATORY_CARE_PROVIDER_SITE_OTHER): Payer: PRIVATE HEALTH INSURANCE | Admitting: *Deleted

## 2019-03-18 ENCOUNTER — Ambulatory Visit
Admission: RE | Admit: 2019-03-18 | Discharge: 2019-03-18 | Disposition: A | Discharge status: Home / Self Care | Payer: PRIVATE HEALTH INSURANCE | Source: Ambulatory Visit | Attending: Family Medicine | Admitting: Family Medicine

## 2019-03-18 ENCOUNTER — Ambulatory Visit: Payer: PRIVATE HEALTH INSURANCE

## 2019-03-18 DIAGNOSIS — Z1231 Encounter for screening mammogram for malignant neoplasm of breast: Secondary | ICD-10-CM

## 2019-03-18 DIAGNOSIS — J309 Allergic rhinitis, unspecified: Secondary | ICD-10-CM | POA: Diagnosis not present

## 2019-03-25 ENCOUNTER — Ambulatory Visit (INDEPENDENT_AMBULATORY_CARE_PROVIDER_SITE_OTHER): Payer: PRIVATE HEALTH INSURANCE | Admitting: *Deleted

## 2019-03-25 DIAGNOSIS — J309 Allergic rhinitis, unspecified: Secondary | ICD-10-CM

## 2019-03-30 ENCOUNTER — Ambulatory Visit (INDEPENDENT_AMBULATORY_CARE_PROVIDER_SITE_OTHER): Payer: PRIVATE HEALTH INSURANCE

## 2019-03-30 DIAGNOSIS — J309 Allergic rhinitis, unspecified: Secondary | ICD-10-CM | POA: Diagnosis not present

## 2019-04-06 ENCOUNTER — Ambulatory Visit (INDEPENDENT_AMBULATORY_CARE_PROVIDER_SITE_OTHER): Payer: PRIVATE HEALTH INSURANCE

## 2019-04-06 DIAGNOSIS — J309 Allergic rhinitis, unspecified: Secondary | ICD-10-CM | POA: Diagnosis not present

## 2019-04-13 ENCOUNTER — Other Ambulatory Visit: Payer: Self-pay | Admitting: *Deleted

## 2019-04-13 MED ORDER — EPINEPHRINE 0.3 MG/0.3ML IJ SOAJ: INTRAMUSCULAR | 0 refills | Status: DC

## 2019-04-13 NOTE — Telephone Encounter (Signed)
Refill for Auvi Q sent in courtesy she will need OV for further refills note placed in her Allergy shot flowsheet

## 2019-04-15 ENCOUNTER — Ambulatory Visit (INDEPENDENT_AMBULATORY_CARE_PROVIDER_SITE_OTHER): Payer: PRIVATE HEALTH INSURANCE

## 2019-04-15 DIAGNOSIS — J309 Allergic rhinitis, unspecified: Secondary | ICD-10-CM | POA: Diagnosis not present

## 2019-04-15 MED ORDER — OLOPATADINE HCL 0.2 % OP SOLN: 1.0000 [drp] | Freq: Every day | OPHTHALMIC | 0 refills | Status: DC | PRN

## 2019-04-24 ENCOUNTER — Ambulatory Visit (INDEPENDENT_AMBULATORY_CARE_PROVIDER_SITE_OTHER): Payer: PRIVATE HEALTH INSURANCE | Admitting: *Deleted

## 2019-04-24 DIAGNOSIS — J309 Allergic rhinitis, unspecified: Secondary | ICD-10-CM | POA: Diagnosis not present

## 2019-04-28 ENCOUNTER — Telehealth: Payer: Self-pay | Admitting: *Deleted

## 2019-04-28 ENCOUNTER — Ambulatory Visit (INDEPENDENT_AMBULATORY_CARE_PROVIDER_SITE_OTHER): Payer: PRIVATE HEALTH INSURANCE | Admitting: *Deleted

## 2019-04-28 DIAGNOSIS — J309 Allergic rhinitis, unspecified: Secondary | ICD-10-CM | POA: Diagnosis not present

## 2019-04-28 NOTE — Telephone Encounter (Signed)
Patient has finally made it to Red on all 3 injections. She is frozen at 0.25 on her Tree-Dmite & Mold-Cat-CR. Her Grass-Weed vial she has been able to get to 0.30 without issue. She has had a couple doses on 0.35 and each time has swollen up to a 4+. Can we freeze at 0.30? Please advise.

## 2019-04-28 NOTE — Telephone Encounter (Signed)
Patient informed and agreed with plan.

## 2019-04-28 NOTE — Telephone Encounter (Signed)
Yes

## 2019-04-29 NOTE — Progress Notes (Signed)
VIALS EXP 04-28-2020 

## 2019-04-30 DIAGNOSIS — J3089 Other allergic rhinitis: Secondary | ICD-10-CM

## 2019-05-05 ENCOUNTER — Ambulatory Visit (INDEPENDENT_AMBULATORY_CARE_PROVIDER_SITE_OTHER): Payer: PRIVATE HEALTH INSURANCE | Admitting: *Deleted

## 2019-05-05 DIAGNOSIS — J309 Allergic rhinitis, unspecified: Secondary | ICD-10-CM | POA: Diagnosis not present

## 2019-05-06 DIAGNOSIS — J301 Allergic rhinitis due to pollen: Secondary | ICD-10-CM | POA: Diagnosis not present

## 2019-05-06 NOTE — Progress Notes (Signed)
VIAL EXP 05-05-2020 

## 2019-05-12 ENCOUNTER — Ambulatory Visit (INDEPENDENT_AMBULATORY_CARE_PROVIDER_SITE_OTHER): Payer: PRIVATE HEALTH INSURANCE | Admitting: *Deleted

## 2019-05-12 DIAGNOSIS — J309 Allergic rhinitis, unspecified: Secondary | ICD-10-CM

## 2019-05-20 ENCOUNTER — Ambulatory Visit (INDEPENDENT_AMBULATORY_CARE_PROVIDER_SITE_OTHER): Payer: PRIVATE HEALTH INSURANCE | Admitting: *Deleted

## 2019-05-20 DIAGNOSIS — J309 Allergic rhinitis, unspecified: Secondary | ICD-10-CM | POA: Diagnosis not present

## 2019-06-02 ENCOUNTER — Ambulatory Visit (INDEPENDENT_AMBULATORY_CARE_PROVIDER_SITE_OTHER): Payer: PRIVATE HEALTH INSURANCE | Admitting: *Deleted

## 2019-06-02 DIAGNOSIS — J309 Allergic rhinitis, unspecified: Secondary | ICD-10-CM

## 2019-06-09 ENCOUNTER — Ambulatory Visit (INDEPENDENT_AMBULATORY_CARE_PROVIDER_SITE_OTHER): Payer: PRIVATE HEALTH INSURANCE | Admitting: *Deleted

## 2019-06-09 DIAGNOSIS — J309 Allergic rhinitis, unspecified: Secondary | ICD-10-CM

## 2019-06-16 ENCOUNTER — Ambulatory Visit (INDEPENDENT_AMBULATORY_CARE_PROVIDER_SITE_OTHER): Payer: PRIVATE HEALTH INSURANCE | Admitting: *Deleted

## 2019-06-16 DIAGNOSIS — J309 Allergic rhinitis, unspecified: Secondary | ICD-10-CM

## 2019-06-22 ENCOUNTER — Ambulatory Visit (INDEPENDENT_AMBULATORY_CARE_PROVIDER_SITE_OTHER): Payer: PRIVATE HEALTH INSURANCE | Admitting: Allergy and Immunology

## 2019-06-22 ENCOUNTER — Encounter: Payer: Self-pay | Admitting: Allergy and Immunology

## 2019-06-22 ENCOUNTER — Other Ambulatory Visit: Payer: Self-pay

## 2019-06-22 ENCOUNTER — Ambulatory Visit: Payer: Self-pay | Admitting: *Deleted

## 2019-06-22 DIAGNOSIS — L500 Allergic urticaria: Secondary | ICD-10-CM

## 2019-06-22 DIAGNOSIS — J3089 Other allergic rhinitis: Secondary | ICD-10-CM | POA: Diagnosis not present

## 2019-06-22 DIAGNOSIS — J309 Allergic rhinitis, unspecified: Secondary | ICD-10-CM

## 2019-06-22 NOTE — Assessment & Plan Note (Signed)
   Continue appropriate allergen avoidance measures, immunotherapy injections per protocol, and azelastine nasal spray as needed.  Nasal saline spray (i.e., Simply Saline) or nasal saline lavage (i.e., NeilMed) is recommended as needed and prior to medicated nasal sprays.  For thick post nasal drainage, add guaifenesin 1200 mg (Mucinex Maximum Strength)  twice daily as needed with adequate hydration as discussed.

## 2019-06-22 NOTE — Progress Notes (Signed)
Follow-up Note  RE: Breanna SenderKindra Lanning Hilliker MRN: [id number] DOB: June 08, 1976 Date of Office Visit: 06/22/2019  Primary care provider: Ofilia NeasSpivey, Steven J, MD Referring provider: Ofilia NeasSpivey, Steven J, MD  History of present illness: Breanna Fuller is a 43 y.o. female with allergic rhinoconjunctivitis on immunotherapy and history of urticaria presenting today for follow-up.  She was last seen in this clinic in March 2019.  She reports that her nasal allergy symptoms have improved while on immunotherapy injections.  She still has occasional thick postnasal drainage, otherwise her nasal and sinus symptoms are well controlled.  She uses azelastine nasal spray occasionally.  She has had somewhat of a prolonged buildup course of immunotherapy because of large local reactions, however she has almost reached maintenance.  She has not had recurrence of generalized urticaria.  She reports that she will occasionally get a small hive or 2 on her hands if she consumes gluten.  Assessment and plan: Other allergic rhinitis  Continue appropriate allergen avoidance measures, immunotherapy injections per protocol, and azelastine nasal spray as needed.  Nasal saline spray (i.e., Simply Saline) or nasal saline lavage (i.e., NeilMed) is recommended as needed and prior to medicated nasal sprays.  For thick post nasal drainage, add guaifenesin 1200 mg (Mucinex Maximum Strength)  twice daily as needed with adequate hydration as discussed.  Urticaria Quiescent.  Levocetirizine (Xyzal) 5 mg daily if needed.   Physical examination: Blood pressure 100/62, pulse 67, temperature 97.8 F (36.6 C), temperature source Temporal, resp. rate 16, height 5\' 7"  (1.702 m), weight 139 lb 6.4 oz (63.2 kg), last menstrual period 12/10/2010, SpO2 96 %.  General: Alert, interactive, in no acute distress. HEENT: TMs pearly gray, turbinates minimally edematous without discharge, post-pharynx mildly erythematous. Neck: Supple without  lymphadenopathy. Lungs: Clear to auscultation without wheezing, rhonchi or rales. CV: Normal S1, S2 without murmurs. Skin: Warm and dry, without lesions or rashes.  The following portions of the patient's history were reviewed and updated as appropriate: allergies, current medications, past family history, past medical history, past social history, past surgical history and problem list.  Allergies as of 06/22/2019      Reactions   Vancomycin Hives   Latex Itching   Monistat [miconazole] Swelling   Terazol [terconazole] Swelling   Wellbutrin [bupropion] Rash      Medication List       Accurate as of June 22, 2019  4:26 PM. If you have any questions, ask your nurse or doctor.        STOP taking these medications   multivitamin tablet Stopped by: [place] Hampshire Carter Colletta Spillers, MD   norethindrone 5 MG tablet Commonly known as: AYGESTIN Stopped by: Wellington Hampshire Carter Charlisa Cham, MD   predniSONE 10 MG tablet Commonly known as: DELTASONE Stopped by: Wellington Hampshire Carter Rashun Grattan, MD   pseudoephedrine 30 MG tablet Commonly known as: SUDAFED Stopped by: Wellington Hampshire Carter Avanni Turnbaugh, MD   sertraline 100 MG tablet Commonly known as: ZOLOFT Stopped by: Wellington Hampshire Carter Jayd Cadieux, MD     TAKE these medications   azelastine 0.1 % nasal spray Commonly known as: ASTELIN 2 sprays in each nostril 1-2 times a day   cetirizine 10 MG tablet Commonly known as: ZYRTEC Take 10 mg by mouth daily.   EPINEPHrine 0.3 mg/0.3 mL Soaj injection Commonly known as: Auvi-Q Use as directed for severe allergic reaction   Fluticasone Propionate 93 MCG/ACT Exhu Commonly known as: Xhance Place 2 sprays into the nose 2 (two) times daily.   guaiFENesin 600 MG 12 hr tablet  Commonly known as: MUCINEX Take 600 mg by mouth as needed.   Olopatadine HCl 0.2 % Soln Commonly known as: Pataday Apply 1 drop to eye daily as needed.   oxymetazoline 0.05 % nasal spray Commonly known as: AFRIN Place 1 spray into both nostrils as needed for congestion.    polyethylene glycol 17 g packet Commonly known as: MIRALAX / GLYCOLAX Take by mouth.   triamcinolone cream 0.1 % Commonly known as: KENALOG Apply topically.   UNABLE TO FIND Med Name: immunotherapy   venlafaxine XR 75 MG 24 hr capsule Commonly known as: EFFEXOR-XR 75 mg daily.       Allergies  Allergen Reactions  . Vancomycin Hives  . Latex Itching  . Monistat [Miconazole] Swelling  . Terazol [Terconazole] Swelling  . Wellbutrin [Bupropion] Rash    I appreciate the opportunity to take part in [place] care. Please do not hesitate to contact me with questions.  Sincerely,   R. Edgar Frisk, MD

## 2019-06-22 NOTE — Patient Instructions (Addendum)
Other allergic rhinitis  Continue appropriate allergen avoidance measures, immunotherapy injections per protocol, and azelastine nasal spray as needed.  Nasal saline spray (i.e., Simply Saline) or nasal saline lavage (i.e., NeilMed) is recommended as needed and prior to medicated nasal sprays.  For thick post nasal drainage, add guaifenesin 1200 mg (Mucinex Maximum Strength)  twice daily as needed with adequate hydration as discussed.  Urticaria Quiescent.  Levocetirizine (Xyzal) 5 mg daily if needed.   Return in about 1 year (around 06/21/2020), or if symptoms worsen or fail to improve.

## 2019-06-22 NOTE — Assessment & Plan Note (Signed)
Quiescent.  Levocetirizine (Xyzal) 5 mg daily if needed.

## 2019-07-02 ENCOUNTER — Ambulatory Visit (INDEPENDENT_AMBULATORY_CARE_PROVIDER_SITE_OTHER): Payer: PRIVATE HEALTH INSURANCE | Admitting: *Deleted

## 2019-07-02 DIAGNOSIS — J309 Allergic rhinitis, unspecified: Secondary | ICD-10-CM

## 2019-07-07 ENCOUNTER — Ambulatory Visit (INDEPENDENT_AMBULATORY_CARE_PROVIDER_SITE_OTHER): Payer: PRIVATE HEALTH INSURANCE | Admitting: *Deleted

## 2019-07-07 DIAGNOSIS — J309 Allergic rhinitis, unspecified: Secondary | ICD-10-CM

## 2019-07-21 ENCOUNTER — Ambulatory Visit (INDEPENDENT_AMBULATORY_CARE_PROVIDER_SITE_OTHER): Payer: PRIVATE HEALTH INSURANCE | Admitting: *Deleted

## 2019-07-21 DIAGNOSIS — J309 Allergic rhinitis, unspecified: Secondary | ICD-10-CM

## 2019-08-06 ENCOUNTER — Ambulatory Visit (INDEPENDENT_AMBULATORY_CARE_PROVIDER_SITE_OTHER): Payer: PRIVATE HEALTH INSURANCE | Admitting: *Deleted

## 2019-08-06 DIAGNOSIS — J309 Allergic rhinitis, unspecified: Secondary | ICD-10-CM | POA: Diagnosis not present

## 2019-08-18 ENCOUNTER — Ambulatory Visit (INDEPENDENT_AMBULATORY_CARE_PROVIDER_SITE_OTHER): Payer: PRIVATE HEALTH INSURANCE

## 2019-08-18 DIAGNOSIS — J309 Allergic rhinitis, unspecified: Secondary | ICD-10-CM

## 2019-09-03 ENCOUNTER — Ambulatory Visit (INDEPENDENT_AMBULATORY_CARE_PROVIDER_SITE_OTHER): Payer: PRIVATE HEALTH INSURANCE | Admitting: *Deleted

## 2019-09-03 DIAGNOSIS — J309 Allergic rhinitis, unspecified: Secondary | ICD-10-CM | POA: Diagnosis not present

## 2019-09-16 ENCOUNTER — Ambulatory Visit (INDEPENDENT_AMBULATORY_CARE_PROVIDER_SITE_OTHER): Payer: PRIVATE HEALTH INSURANCE

## 2019-09-16 DIAGNOSIS — J309 Allergic rhinitis, unspecified: Secondary | ICD-10-CM

## 2019-10-01 ENCOUNTER — Ambulatory Visit (INDEPENDENT_AMBULATORY_CARE_PROVIDER_SITE_OTHER): Payer: PRIVATE HEALTH INSURANCE | Admitting: *Deleted

## 2019-10-01 DIAGNOSIS — J309 Allergic rhinitis, unspecified: Secondary | ICD-10-CM | POA: Diagnosis not present

## 2019-10-12 DIAGNOSIS — J3089 Other allergic rhinitis: Secondary | ICD-10-CM | POA: Diagnosis not present

## 2019-10-12 NOTE — Progress Notes (Signed)
VIALS EXP 10-11-20 

## 2019-10-13 DIAGNOSIS — J301 Allergic rhinitis due to pollen: Secondary | ICD-10-CM | POA: Diagnosis not present

## 2019-10-15 ENCOUNTER — Ambulatory Visit (INDEPENDENT_AMBULATORY_CARE_PROVIDER_SITE_OTHER): Payer: PRIVATE HEALTH INSURANCE

## 2019-10-15 DIAGNOSIS — J309 Allergic rhinitis, unspecified: Secondary | ICD-10-CM | POA: Diagnosis not present

## 2019-10-27 ENCOUNTER — Ambulatory Visit (INDEPENDENT_AMBULATORY_CARE_PROVIDER_SITE_OTHER): Payer: PRIVATE HEALTH INSURANCE

## 2019-10-27 DIAGNOSIS — J309 Allergic rhinitis, unspecified: Secondary | ICD-10-CM | POA: Diagnosis not present

## 2019-11-10 ENCOUNTER — Ambulatory Visit (INDEPENDENT_AMBULATORY_CARE_PROVIDER_SITE_OTHER): Payer: Managed Care, Other (non HMO)

## 2019-11-10 DIAGNOSIS — J309 Allergic rhinitis, unspecified: Secondary | ICD-10-CM | POA: Diagnosis not present

## 2019-12-01 ENCOUNTER — Ambulatory Visit (INDEPENDENT_AMBULATORY_CARE_PROVIDER_SITE_OTHER): Payer: Managed Care, Other (non HMO)

## 2019-12-01 DIAGNOSIS — J309 Allergic rhinitis, unspecified: Secondary | ICD-10-CM | POA: Diagnosis not present

## 2019-12-15 ENCOUNTER — Ambulatory Visit (INDEPENDENT_AMBULATORY_CARE_PROVIDER_SITE_OTHER): Payer: Managed Care, Other (non HMO)

## 2019-12-15 DIAGNOSIS — J309 Allergic rhinitis, unspecified: Secondary | ICD-10-CM | POA: Diagnosis not present

## 2019-12-29 ENCOUNTER — Ambulatory Visit (INDEPENDENT_AMBULATORY_CARE_PROVIDER_SITE_OTHER): Payer: Managed Care, Other (non HMO)

## 2019-12-29 DIAGNOSIS — J309 Allergic rhinitis, unspecified: Secondary | ICD-10-CM

## 2020-01-12 ENCOUNTER — Ambulatory Visit (INDEPENDENT_AMBULATORY_CARE_PROVIDER_SITE_OTHER): Payer: Managed Care, Other (non HMO)

## 2020-01-12 DIAGNOSIS — J309 Allergic rhinitis, unspecified: Secondary | ICD-10-CM

## 2020-01-26 ENCOUNTER — Ambulatory Visit (INDEPENDENT_AMBULATORY_CARE_PROVIDER_SITE_OTHER): Payer: Managed Care, Other (non HMO)

## 2020-01-26 DIAGNOSIS — J309 Allergic rhinitis, unspecified: Secondary | ICD-10-CM

## 2020-02-09 ENCOUNTER — Ambulatory Visit (INDEPENDENT_AMBULATORY_CARE_PROVIDER_SITE_OTHER): Payer: Managed Care, Other (non HMO)

## 2020-02-09 DIAGNOSIS — J309 Allergic rhinitis, unspecified: Secondary | ICD-10-CM

## 2020-02-15 ENCOUNTER — Ambulatory Visit (INDEPENDENT_AMBULATORY_CARE_PROVIDER_SITE_OTHER): Payer: Managed Care, Other (non HMO)

## 2020-02-15 ENCOUNTER — Other Ambulatory Visit: Payer: Self-pay | Admitting: Family Medicine

## 2020-02-15 DIAGNOSIS — J309 Allergic rhinitis, unspecified: Secondary | ICD-10-CM

## 2020-02-15 DIAGNOSIS — Z1231 Encounter for screening mammogram for malignant neoplasm of breast: Secondary | ICD-10-CM

## 2020-02-23 ENCOUNTER — Ambulatory Visit (INDEPENDENT_AMBULATORY_CARE_PROVIDER_SITE_OTHER): Payer: Managed Care, Other (non HMO)

## 2020-02-23 DIAGNOSIS — J309 Allergic rhinitis, unspecified: Secondary | ICD-10-CM

## 2020-03-01 ENCOUNTER — Ambulatory Visit (INDEPENDENT_AMBULATORY_CARE_PROVIDER_SITE_OTHER): Payer: Managed Care, Other (non HMO)

## 2020-03-01 DIAGNOSIS — J309 Allergic rhinitis, unspecified: Secondary | ICD-10-CM

## 2020-03-03 ENCOUNTER — Other Ambulatory Visit: Payer: Self-pay | Admitting: Family Medicine

## 2020-03-03 DIAGNOSIS — Z1231 Encounter for screening mammogram for malignant neoplasm of breast: Secondary | ICD-10-CM

## 2020-03-04 ENCOUNTER — Ambulatory Visit
Admission: RE | Admit: 2020-03-04 | Discharge: 2020-03-04 | Disposition: A | Discharge status: Home / Self Care | Payer: PRIVATE HEALTH INSURANCE | Source: Ambulatory Visit | Attending: Family Medicine | Admitting: Family Medicine

## 2020-03-04 ENCOUNTER — Other Ambulatory Visit: Payer: Self-pay

## 2020-03-04 DIAGNOSIS — Z1231 Encounter for screening mammogram for malignant neoplasm of breast: Secondary | ICD-10-CM

## 2020-03-08 ENCOUNTER — Ambulatory Visit (INDEPENDENT_AMBULATORY_CARE_PROVIDER_SITE_OTHER): Payer: Managed Care, Other (non HMO)

## 2020-03-08 DIAGNOSIS — J309 Allergic rhinitis, unspecified: Secondary | ICD-10-CM

## 2020-03-15 ENCOUNTER — Ambulatory Visit (INDEPENDENT_AMBULATORY_CARE_PROVIDER_SITE_OTHER): Payer: Managed Care, Other (non HMO)

## 2020-03-15 ENCOUNTER — Telehealth: Payer: Self-pay

## 2020-03-15 DIAGNOSIS — J309 Allergic rhinitis, unspecified: Secondary | ICD-10-CM

## 2020-03-15 NOTE — Telephone Encounter (Signed)
Lets not push to the higher dose during the pollen season. Stay at 0.2cc for now. Thanks.

## 2020-03-15 NOTE — Telephone Encounter (Signed)
Flowsheet updated to reflect your instructions.

## 2020-03-15 NOTE — Telephone Encounter (Signed)
Patient receives 3 allergy injections. She is frozen at 0.25cc on 2 of the 3. Her Grass-Weed vial is supposed to freeze at 0.3cc. We have not gotten past 0.2cc due to 2+ reactions. Do you want to freeze her again at the 0.2cc or keep trying for the 0.3cc on that specific vial. Patient would like to know as well. She does admit to feeling some benefit even with these lowered dosages. Thank you.

## 2020-03-22 ENCOUNTER — Ambulatory Visit (INDEPENDENT_AMBULATORY_CARE_PROVIDER_SITE_OTHER): Payer: Managed Care, Other (non HMO)

## 2020-03-22 DIAGNOSIS — J309 Allergic rhinitis, unspecified: Secondary | ICD-10-CM | POA: Diagnosis not present

## 2020-03-23 ENCOUNTER — Ambulatory Visit: Payer: PRIVATE HEALTH INSURANCE

## 2020-03-31 ENCOUNTER — Telehealth: Payer: Self-pay | Admitting: Allergy and Immunology

## 2020-03-31 MED ORDER — EPINEPHRINE 0.3 MG/0.3ML IJ SOAJ: INTRAMUSCULAR | 0 refills | Status: DC

## 2020-03-31 NOTE — Telephone Encounter (Signed)
Patient meds were sent for a courtesy refill and will follow up 06/16/2020 and will now be seeing Dr. Dellis Anes.

## 2020-03-31 NOTE — Telephone Encounter (Signed)
Patient called and needs to have the Auvi-Q refilled. [phone]

## 2020-04-06 ENCOUNTER — Ambulatory Visit (INDEPENDENT_AMBULATORY_CARE_PROVIDER_SITE_OTHER): Payer: Managed Care, Other (non HMO)

## 2020-04-06 DIAGNOSIS — J309 Allergic rhinitis, unspecified: Secondary | ICD-10-CM | POA: Diagnosis not present

## 2020-04-19 ENCOUNTER — Ambulatory Visit (INDEPENDENT_AMBULATORY_CARE_PROVIDER_SITE_OTHER): Payer: Managed Care, Other (non HMO)

## 2020-04-19 DIAGNOSIS — J309 Allergic rhinitis, unspecified: Secondary | ICD-10-CM | POA: Diagnosis not present

## 2020-05-03 ENCOUNTER — Ambulatory Visit (INDEPENDENT_AMBULATORY_CARE_PROVIDER_SITE_OTHER): Payer: Managed Care, Other (non HMO)

## 2020-05-03 DIAGNOSIS — J309 Allergic rhinitis, unspecified: Secondary | ICD-10-CM

## 2020-05-19 ENCOUNTER — Ambulatory Visit (INDEPENDENT_AMBULATORY_CARE_PROVIDER_SITE_OTHER): Payer: Managed Care, Other (non HMO)

## 2020-05-19 DIAGNOSIS — J309 Allergic rhinitis, unspecified: Secondary | ICD-10-CM

## 2020-05-31 ENCOUNTER — Ambulatory Visit (INDEPENDENT_AMBULATORY_CARE_PROVIDER_SITE_OTHER): Payer: Managed Care, Other (non HMO)

## 2020-05-31 DIAGNOSIS — J309 Allergic rhinitis, unspecified: Secondary | ICD-10-CM

## 2020-06-03 DIAGNOSIS — J3089 Other allergic rhinitis: Secondary | ICD-10-CM

## 2020-06-03 NOTE — Progress Notes (Signed)
VIALS EXP 06-03-21 

## 2020-06-16 ENCOUNTER — Encounter: Payer: Self-pay | Admitting: Allergy & Immunology

## 2020-06-16 ENCOUNTER — Other Ambulatory Visit: Payer: Self-pay

## 2020-06-16 ENCOUNTER — Ambulatory Visit: Payer: Managed Care, Other (non HMO) | Admitting: Allergy & Immunology

## 2020-06-16 ENCOUNTER — Ambulatory Visit: Payer: Self-pay

## 2020-06-16 VITALS — BP 102/58 | HR 70 | Temp 98.6°F | Resp 16 | Ht 67.5 in | Wt 126.0 lb

## 2020-06-16 DIAGNOSIS — J302 Other seasonal allergic rhinitis: Secondary | ICD-10-CM | POA: Diagnosis not present

## 2020-06-16 DIAGNOSIS — J3089 Other allergic rhinitis: Secondary | ICD-10-CM | POA: Diagnosis not present

## 2020-06-16 DIAGNOSIS — J309 Allergic rhinitis, unspecified: Secondary | ICD-10-CM

## 2020-06-16 NOTE — Progress Notes (Signed)
FOLLOW UP  Date of Service/Encounter:  06/16/20   Assessment:   Perennial and seasonal allergic rhinitis - on allergen immunotherapy  Plan/Recommendations:   1. Perennial and seasonal allergy rhinitis - Continue with allergy shots at the same schedule. - I am counting 2020 as your maintenance start date, so three years would be 2023 and five years would be 2025.  - Continue with cetirizine 10mg  daily as needed.   2. Return in about 1 year (around 06/16/2021). This can be an in-person, a virtual Webex or a telephone follow up visit.  Subjective:   Breanna Fuller is a 44 y.o. female presenting today for follow up of  Chief Complaint  Patient presents with  . Allergic Rhinitis     doing okay. no issues. doing well with her immunotherapy.     56 Breanna Fuller has a history of the following: Patient Active Problem List   Diagnosis Date Noted  . Acute sinusitis 01/21/2018  . GI symptoms 06/26/2016  . Urticaria 12/21/2015  . Other allergic rhinitis 08/09/2015    History obtained from: chart review and patient.  Breanna Fuller is a 44 y.o. female presenting for a follow up visit.  She was last seen in August 2020 by Dr. September 2020.  She has a history of perennial and seasonal allergic rhinitis.  She is on allergen immunotherapy.  She has done well with the allergen immunotherapy. She was getting sinus infectious every couple of months previously but she cannot remember at this time.   Breanna Fuller is on allergen immunotherapy. She receives two injections. Immunotherapy script #1 contains trees and dust mites. She currently receives 0.7mL of the RED vial (1/100). Immunotherapy script #2 contains molds, cat and cockroach. She currently receives 0.19mL of the RED vial (1/100). Immunotherapy script #3 contains grasses and weeds. She currently receives 0.2 mL of the RED vial (1/100). She started shots in 2016 or so and reached maintenance in sometime in 2020 or so (reached Red Vial at various  time courses).   She has gluten and gets rashes. She does not cheat at all, except if iut is wine. She is a 2021. She received the Pfizer vaccination in January and February.   Otherwise, there have been no changes to her past medical history, surgical history, family history, or social history.    Review of Systems  Constitutional: Negative.  Negative for fever, malaise/fatigue and weight loss.  HENT: Negative.  Negative for congestion, ear discharge and ear pain.   Eyes: Negative for pain, discharge and redness.  Respiratory: Negative for cough, sputum production, shortness of breath and wheezing.   Cardiovascular: Negative.  Negative for chest pain and palpitations.  Gastrointestinal: Negative for abdominal pain, constipation, diarrhea, heartburn, nausea and vomiting.  Skin: Negative.  Negative for itching and rash.  Neurological: Negative for dizziness and headaches.  Endo/Heme/Allergies: Negative for environmental allergies. Does not bruise/bleed easily.       Objective:   Blood pressure (!) 102/58, pulse 70, temperature 98.6 F (37 C), temperature source Temporal, resp. rate 16, height 5' 7.5" (1.715 m), weight 126 lb (57.2 kg), last menstrual period 12/10/2010, SpO2 97 %. Body mass index is 19.44 kg/m.   Physical Exam:  Physical Exam Constitutional:      Appearance: Normal appearance. She is well-developed.  HENT:     Head: Normocephalic and atraumatic.     Right Ear: Tympanic membrane, ear canal and external ear normal.     Left Ear: Tympanic membrane, ear canal and external  ear normal.     Nose: No nasal deformity, septal deviation, mucosal edema or rhinorrhea.     Right Turbinates: Enlarged and swollen.     Left Turbinates: Enlarged and swollen.     Right Sinus: No maxillary sinus tenderness or frontal sinus tenderness.     Left Sinus: No maxillary sinus tenderness or frontal sinus tenderness.     Mouth/Throat:     Mouth: Mucous membranes are not pale  and not dry.     Pharynx: Uvula midline.     Comments: Cobblestoning in the posterior oropharynx.  Eyes:     General:        Right eye: No discharge.        Left eye: No discharge.     Conjunctiva/sclera: Conjunctivae normal.     Right eye: Right conjunctiva is not injected. No chemosis.    Left eye: Left conjunctiva is not injected. No chemosis.    Pupils: Pupils are equal, round, and reactive to light.  Cardiovascular:     Rate and Rhythm: Normal rate and regular rhythm.     Heart sounds: Normal heart sounds.  Pulmonary:     Effort: Pulmonary effort is normal. No tachypnea, accessory muscle usage or respiratory distress.     Breath sounds: Normal breath sounds. No wheezing, rhonchi or rales.  Chest:     Chest wall: No tenderness.  Lymphadenopathy:     Cervical: No cervical adenopathy.  Skin:    Coloration: Skin is not pale.     Findings: No abrasion, erythema, petechiae or rash. Rash is not papular, urticarial or vesicular.  Neurological:     Mental Status: She is alert.  Psychiatric:        Behavior: Behavior is cooperative.      Diagnostic studies: none      Malachi Bonds, MD  [facility]

## 2020-06-16 NOTE — Patient Instructions (Addendum)
1. Perennial and seasonal allergy rhinitis - Continue with allergy shots at the same schedule. - I am counting 2020 as your maintenance start date, so three years would be 2023 and five years would be 2025.  - Continue with cetirizine 10mg  daily as needed.   2. Return in about 1 year (around 06/16/2021). This can be an in-person, a virtual Webex or a telephone follow up visit.    Please inform 06/18/2021 of any Emergency Department visits, hospitalizations, or changes in symptoms. Call us before going to the ED for breathing or allergy symptoms since we might be able to fit you in for a sick visit. Feel free to contact us anytime with any questions, problems, or concerns.  It was a pleasure to see you again today!  Websites that have reliable patient information: 1. American Academy of Asthma, Allergy, and Immunology: www.aaaai.org 2. Food Allergy Research and Education (FARE): foodallergy.org 3. Mothers of Asthmatics: http://www.asthmacommunitynetwork.org 4. American College of Allergy, Asthma, and Immunology: www.acaai.org   COVID-19 Vaccine Information can be found at: [place] For questions related to vaccine distribution or appointments, please email [email or web address] or call [phone].     "Like" [phone] on Facebook and Instagram for our latest updates!        Make sure you are registered to vote! If you have moved or changed any of your contact information, you will need to get this updated before voting!  In some cases, you MAY be able to register to vote online: [place]

## 2020-06-30 ENCOUNTER — Ambulatory Visit (INDEPENDENT_AMBULATORY_CARE_PROVIDER_SITE_OTHER): Payer: Managed Care, Other (non HMO)

## 2020-06-30 DIAGNOSIS — J309 Allergic rhinitis, unspecified: Secondary | ICD-10-CM | POA: Diagnosis not present

## 2020-07-12 ENCOUNTER — Ambulatory Visit (INDEPENDENT_AMBULATORY_CARE_PROVIDER_SITE_OTHER): Payer: Managed Care, Other (non HMO) | Admitting: *Deleted

## 2020-07-12 DIAGNOSIS — J309 Allergic rhinitis, unspecified: Secondary | ICD-10-CM | POA: Diagnosis not present

## 2020-07-26 ENCOUNTER — Ambulatory Visit (INDEPENDENT_AMBULATORY_CARE_PROVIDER_SITE_OTHER): Payer: Managed Care, Other (non HMO) | Admitting: *Deleted

## 2020-07-26 DIAGNOSIS — J309 Allergic rhinitis, unspecified: Secondary | ICD-10-CM | POA: Diagnosis not present

## 2020-08-08 ENCOUNTER — Ambulatory Visit (INDEPENDENT_AMBULATORY_CARE_PROVIDER_SITE_OTHER): Payer: Managed Care, Other (non HMO)

## 2020-08-08 DIAGNOSIS — J309 Allergic rhinitis, unspecified: Secondary | ICD-10-CM | POA: Diagnosis not present

## 2020-08-23 ENCOUNTER — Ambulatory Visit (INDEPENDENT_AMBULATORY_CARE_PROVIDER_SITE_OTHER): Payer: Managed Care, Other (non HMO)

## 2020-08-23 DIAGNOSIS — J309 Allergic rhinitis, unspecified: Secondary | ICD-10-CM

## 2020-09-08 ENCOUNTER — Ambulatory Visit (INDEPENDENT_AMBULATORY_CARE_PROVIDER_SITE_OTHER): Payer: Managed Care, Other (non HMO)

## 2020-09-08 DIAGNOSIS — J309 Allergic rhinitis, unspecified: Secondary | ICD-10-CM | POA: Diagnosis not present

## 2020-09-20 ENCOUNTER — Ambulatory Visit (INDEPENDENT_AMBULATORY_CARE_PROVIDER_SITE_OTHER): Payer: Managed Care, Other (non HMO) | Admitting: *Deleted

## 2020-09-20 DIAGNOSIS — J309 Allergic rhinitis, unspecified: Secondary | ICD-10-CM

## 2020-10-04 ENCOUNTER — Ambulatory Visit (INDEPENDENT_AMBULATORY_CARE_PROVIDER_SITE_OTHER): Payer: Managed Care, Other (non HMO) | Admitting: *Deleted

## 2020-10-04 DIAGNOSIS — J309 Allergic rhinitis, unspecified: Secondary | ICD-10-CM

## 2020-10-11 ENCOUNTER — Ambulatory Visit (INDEPENDENT_AMBULATORY_CARE_PROVIDER_SITE_OTHER): Payer: Managed Care, Other (non HMO)

## 2020-10-11 DIAGNOSIS — J309 Allergic rhinitis, unspecified: Secondary | ICD-10-CM

## 2020-10-18 ENCOUNTER — Ambulatory Visit (INDEPENDENT_AMBULATORY_CARE_PROVIDER_SITE_OTHER): Payer: Managed Care, Other (non HMO) | Admitting: *Deleted

## 2020-10-18 DIAGNOSIS — J309 Allergic rhinitis, unspecified: Secondary | ICD-10-CM | POA: Diagnosis not present

## 2020-10-25 ENCOUNTER — Ambulatory Visit (INDEPENDENT_AMBULATORY_CARE_PROVIDER_SITE_OTHER): Payer: Managed Care, Other (non HMO) | Admitting: *Deleted

## 2020-10-25 DIAGNOSIS — J309 Allergic rhinitis, unspecified: Secondary | ICD-10-CM

## 2020-11-01 ENCOUNTER — Ambulatory Visit (INDEPENDENT_AMBULATORY_CARE_PROVIDER_SITE_OTHER): Payer: Managed Care, Other (non HMO)

## 2020-11-01 DIAGNOSIS — J309 Allergic rhinitis, unspecified: Secondary | ICD-10-CM | POA: Diagnosis not present

## 2020-11-15 ENCOUNTER — Ambulatory Visit (INDEPENDENT_AMBULATORY_CARE_PROVIDER_SITE_OTHER): Payer: Managed Care, Other (non HMO)

## 2020-11-15 DIAGNOSIS — J309 Allergic rhinitis, unspecified: Secondary | ICD-10-CM | POA: Diagnosis not present

## 2020-12-06 ENCOUNTER — Ambulatory Visit (INDEPENDENT_AMBULATORY_CARE_PROVIDER_SITE_OTHER): Payer: Managed Care, Other (non HMO) | Admitting: *Deleted

## 2020-12-06 DIAGNOSIS — J309 Allergic rhinitis, unspecified: Secondary | ICD-10-CM | POA: Diagnosis not present

## 2020-12-19 ENCOUNTER — Ambulatory Visit (INDEPENDENT_AMBULATORY_CARE_PROVIDER_SITE_OTHER): Payer: Managed Care, Other (non HMO)

## 2020-12-19 DIAGNOSIS — J309 Allergic rhinitis, unspecified: Secondary | ICD-10-CM

## 2021-01-03 ENCOUNTER — Ambulatory Visit (INDEPENDENT_AMBULATORY_CARE_PROVIDER_SITE_OTHER): Payer: Managed Care, Other (non HMO) | Admitting: *Deleted

## 2021-01-03 DIAGNOSIS — J309 Allergic rhinitis, unspecified: Secondary | ICD-10-CM

## 2021-01-17 ENCOUNTER — Ambulatory Visit (INDEPENDENT_AMBULATORY_CARE_PROVIDER_SITE_OTHER): Payer: Managed Care, Other (non HMO) | Admitting: *Deleted

## 2021-01-17 DIAGNOSIS — J309 Allergic rhinitis, unspecified: Secondary | ICD-10-CM | POA: Diagnosis not present

## 2021-01-31 ENCOUNTER — Ambulatory Visit (INDEPENDENT_AMBULATORY_CARE_PROVIDER_SITE_OTHER): Payer: Managed Care, Other (non HMO) | Admitting: *Deleted

## 2021-01-31 DIAGNOSIS — J309 Allergic rhinitis, unspecified: Secondary | ICD-10-CM | POA: Diagnosis not present

## 2021-02-07 ENCOUNTER — Other Ambulatory Visit: Payer: Self-pay | Admitting: Family Medicine

## 2021-02-07 DIAGNOSIS — Z1231 Encounter for screening mammogram for malignant neoplasm of breast: Secondary | ICD-10-CM

## 2021-02-14 ENCOUNTER — Ambulatory Visit (INDEPENDENT_AMBULATORY_CARE_PROVIDER_SITE_OTHER): Payer: Managed Care, Other (non HMO) | Admitting: *Deleted

## 2021-02-14 DIAGNOSIS — J309 Allergic rhinitis, unspecified: Secondary | ICD-10-CM | POA: Diagnosis not present

## 2021-03-01 DIAGNOSIS — J3089 Other allergic rhinitis: Secondary | ICD-10-CM | POA: Diagnosis not present

## 2021-03-01 NOTE — Progress Notes (Signed)
VIALS EXP 03-01-22 

## 2021-03-02 ENCOUNTER — Ambulatory Visit (INDEPENDENT_AMBULATORY_CARE_PROVIDER_SITE_OTHER): Payer: Managed Care, Other (non HMO) | Admitting: *Deleted

## 2021-03-02 DIAGNOSIS — J309 Allergic rhinitis, unspecified: Secondary | ICD-10-CM

## 2021-03-21 ENCOUNTER — Ambulatory Visit (INDEPENDENT_AMBULATORY_CARE_PROVIDER_SITE_OTHER): Payer: Managed Care, Other (non HMO) | Admitting: *Deleted

## 2021-03-21 DIAGNOSIS — J309 Allergic rhinitis, unspecified: Secondary | ICD-10-CM | POA: Diagnosis not present

## 2021-03-29 ENCOUNTER — Other Ambulatory Visit: Payer: Self-pay

## 2021-03-29 ENCOUNTER — Ambulatory Visit
Admission: RE | Admit: 2021-03-29 | Discharge: 2021-03-29 | Disposition: A | Discharge status: Home / Self Care | Payer: Managed Care, Other (non HMO) | Source: Ambulatory Visit | Attending: Family Medicine | Admitting: Family Medicine

## 2021-03-29 DIAGNOSIS — Z1231 Encounter for screening mammogram for malignant neoplasm of breast: Secondary | ICD-10-CM

## 2021-04-06 ENCOUNTER — Ambulatory Visit (INDEPENDENT_AMBULATORY_CARE_PROVIDER_SITE_OTHER): Payer: Managed Care, Other (non HMO) | Admitting: *Deleted

## 2021-04-06 DIAGNOSIS — J309 Allergic rhinitis, unspecified: Secondary | ICD-10-CM

## 2021-04-18 ENCOUNTER — Ambulatory Visit (INDEPENDENT_AMBULATORY_CARE_PROVIDER_SITE_OTHER): Payer: Managed Care, Other (non HMO) | Admitting: *Deleted

## 2021-04-18 DIAGNOSIS — J309 Allergic rhinitis, unspecified: Secondary | ICD-10-CM

## 2021-05-02 ENCOUNTER — Ambulatory Visit (INDEPENDENT_AMBULATORY_CARE_PROVIDER_SITE_OTHER): Payer: Managed Care, Other (non HMO) | Admitting: *Deleted

## 2021-05-02 DIAGNOSIS — J309 Allergic rhinitis, unspecified: Secondary | ICD-10-CM

## 2021-05-08 IMAGING — MG DIGITAL SCREENING BILATERAL MAMMOGRAM WITH TOMO AND CAD
8 series · 9 of 24 positions shown · non-contrast
Comparison: Previous exam(s).

CLINICAL DATA: Screening.

EXAM:
DIGITAL SCREENING BILATERAL MAMMOGRAM WITH TOMO AND CAD

[R CC synth-2D]
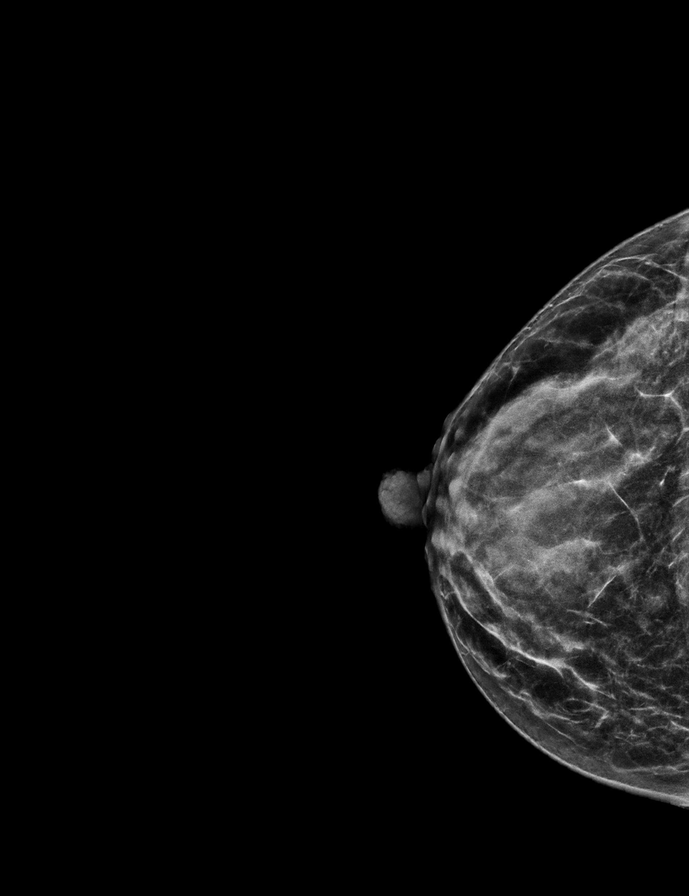

[L MLO synth-2D]
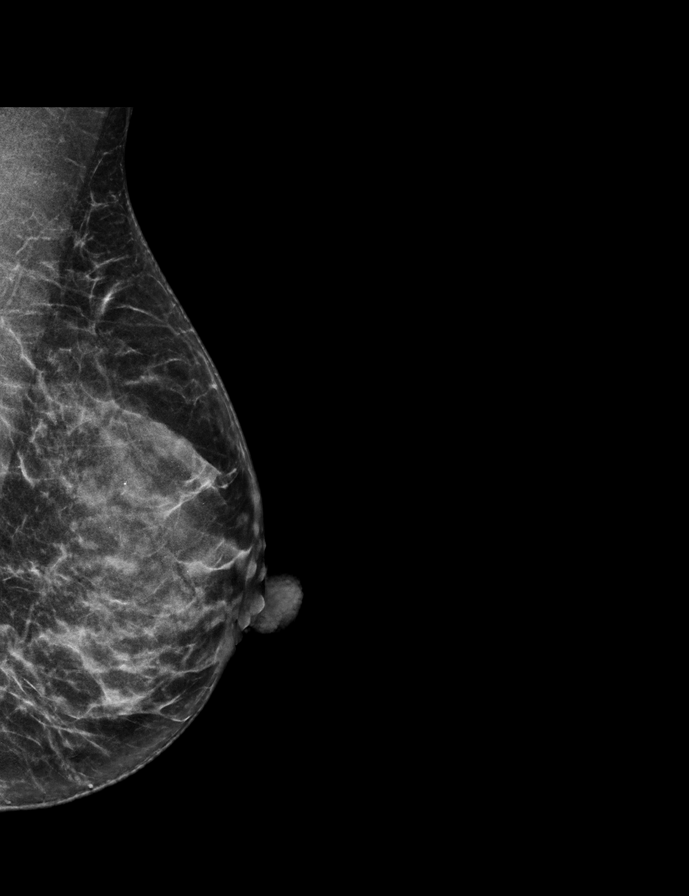

[R MLO synth-2D]
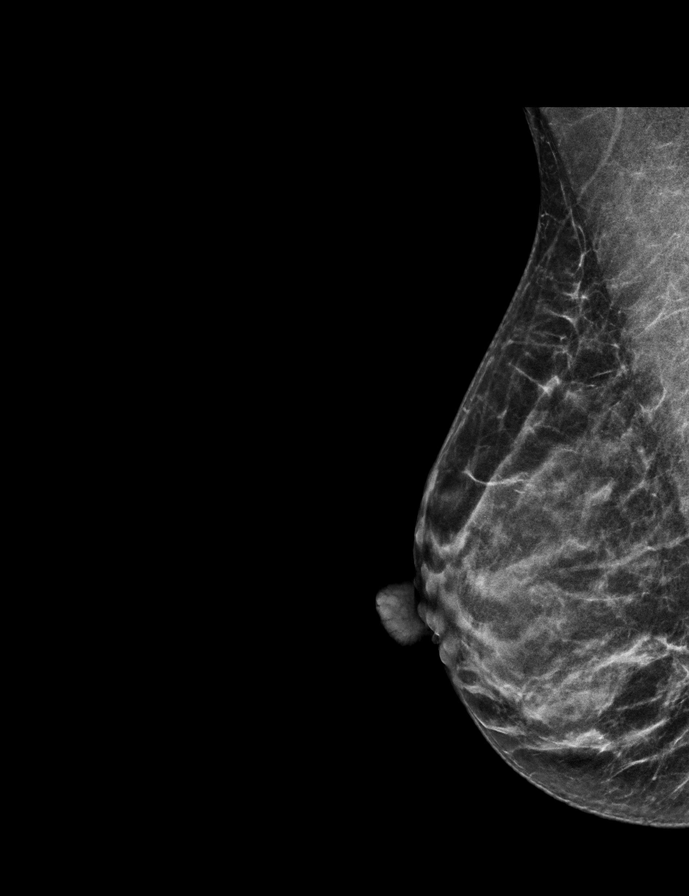

[L CC synth-2D]
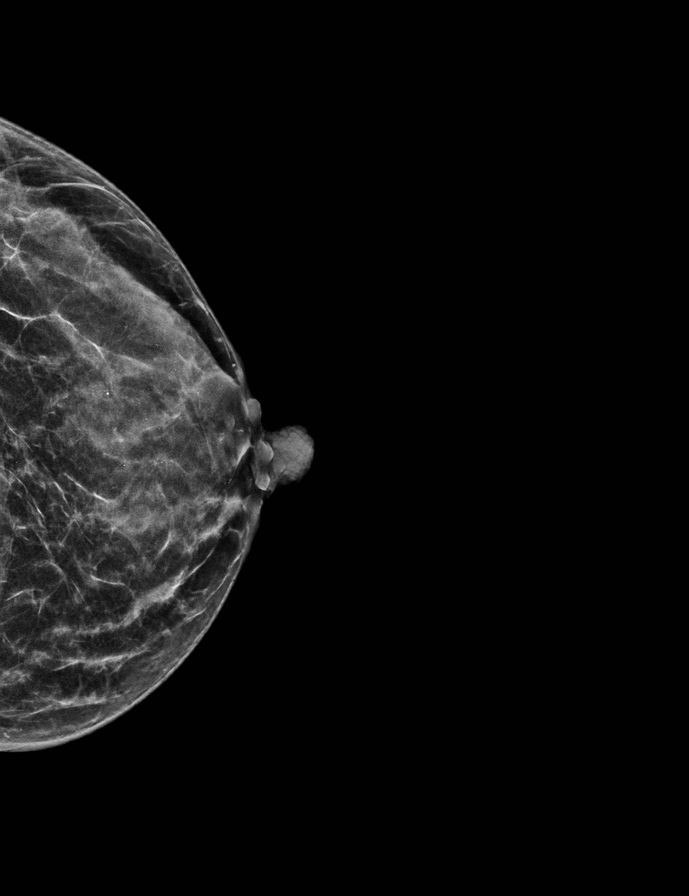

[R MLO tomo · 2 of 47 frames shown]
[frame 16/47]
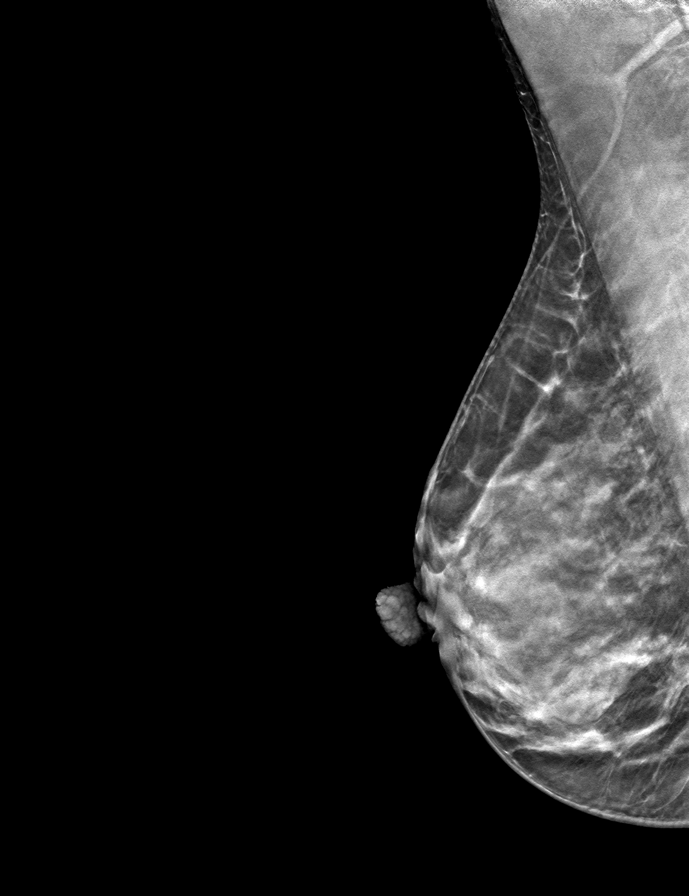
[frame 24/47]
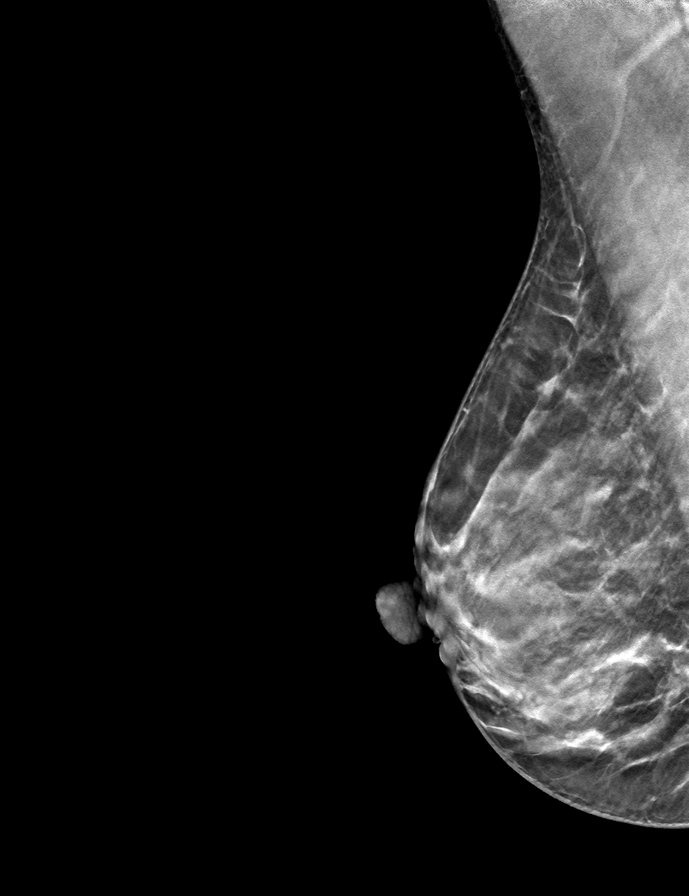

[L MLO tomo · tomo slice 26/51.0]
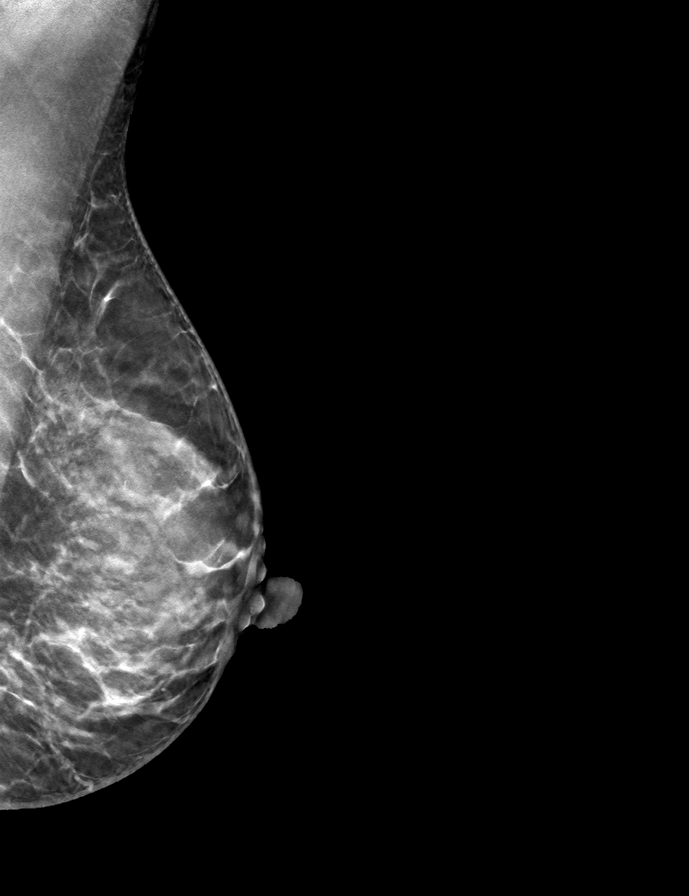

[L CC tomo · tomo slice 23/46.0]
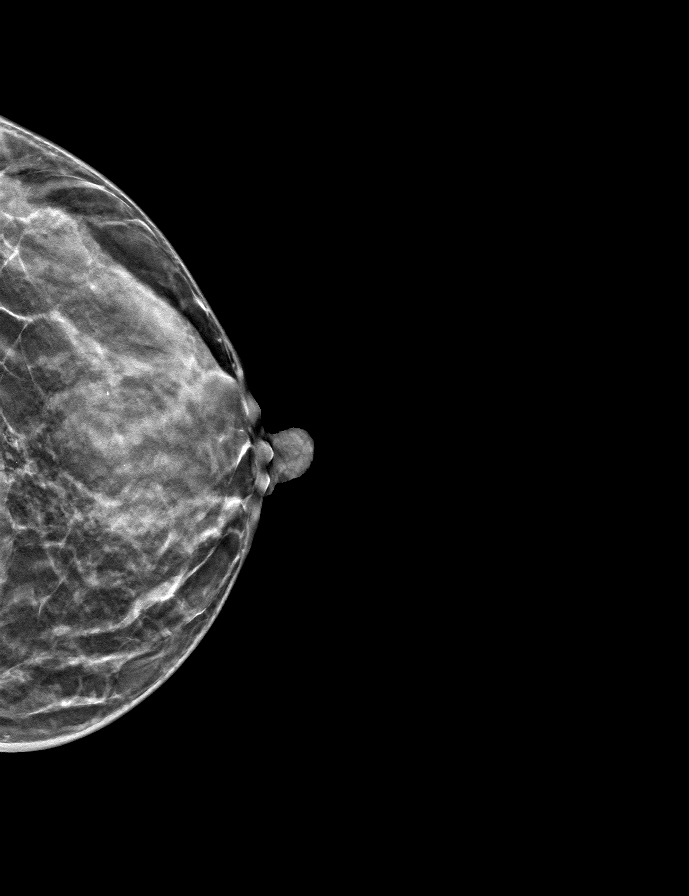

[R CC tomo · tomo slice 25/50.0]
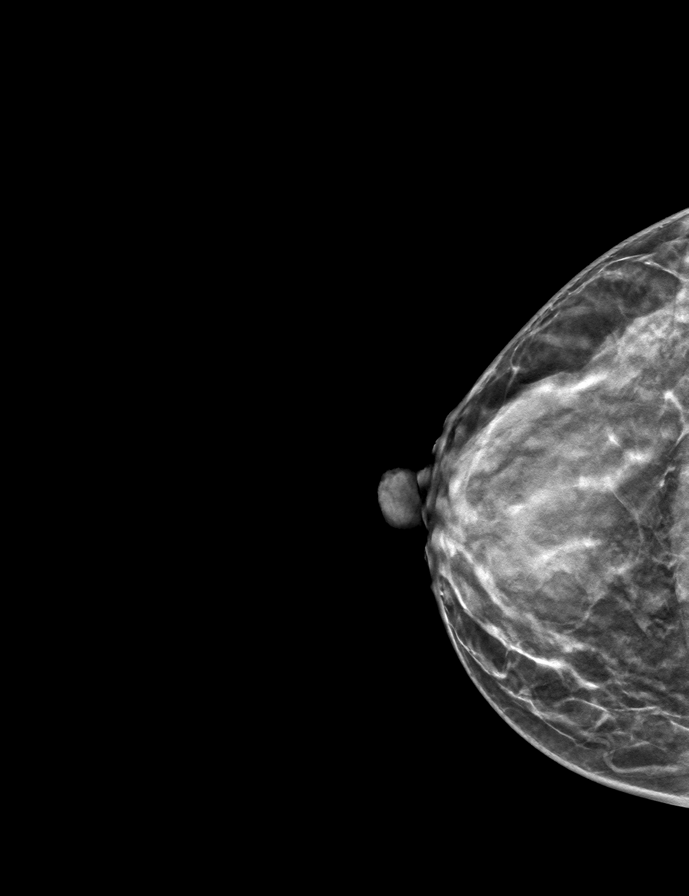

[9 of 24 positions shown; findings below may reference images not displayed]

ACR Breast Density Category c: The breast tissue is heterogeneously
dense, which may obscure small masses.
FINDINGS: There are no findings suspicious for malignancy. Images were
processed with CAD.
IMPRESSION: No mammographic evidence of malignancy. A result letter of this
screening mammogram will be mailed directly to the patient.

RECOMMENDATION:
Screening mammogram in one year. (Code:[id number])

BI-RADS CATEGORY  1: Negative.

## 2021-05-18 ENCOUNTER — Ambulatory Visit (INDEPENDENT_AMBULATORY_CARE_PROVIDER_SITE_OTHER): Payer: Managed Care, Other (non HMO) | Admitting: *Deleted

## 2021-05-18 DIAGNOSIS — J309 Allergic rhinitis, unspecified: Secondary | ICD-10-CM | POA: Diagnosis not present

## 2021-05-30 ENCOUNTER — Ambulatory Visit (INDEPENDENT_AMBULATORY_CARE_PROVIDER_SITE_OTHER): Payer: Managed Care, Other (non HMO)

## 2021-05-30 DIAGNOSIS — J309 Allergic rhinitis, unspecified: Secondary | ICD-10-CM

## 2021-06-06 ENCOUNTER — Ambulatory Visit (INDEPENDENT_AMBULATORY_CARE_PROVIDER_SITE_OTHER): Payer: Managed Care, Other (non HMO) | Admitting: *Deleted

## 2021-06-06 DIAGNOSIS — J309 Allergic rhinitis, unspecified: Secondary | ICD-10-CM | POA: Diagnosis not present

## 2021-06-15 ENCOUNTER — Ambulatory Visit (INDEPENDENT_AMBULATORY_CARE_PROVIDER_SITE_OTHER): Payer: Managed Care, Other (non HMO) | Admitting: *Deleted

## 2021-06-15 DIAGNOSIS — J309 Allergic rhinitis, unspecified: Secondary | ICD-10-CM | POA: Diagnosis not present

## 2021-06-20 ENCOUNTER — Ambulatory Visit (INDEPENDENT_AMBULATORY_CARE_PROVIDER_SITE_OTHER): Payer: Managed Care, Other (non HMO) | Admitting: *Deleted

## 2021-06-20 DIAGNOSIS — J309 Allergic rhinitis, unspecified: Secondary | ICD-10-CM

## 2021-06-27 ENCOUNTER — Ambulatory Visit (INDEPENDENT_AMBULATORY_CARE_PROVIDER_SITE_OTHER): Payer: Managed Care, Other (non HMO) | Admitting: *Deleted

## 2021-06-27 DIAGNOSIS — J309 Allergic rhinitis, unspecified: Secondary | ICD-10-CM

## 2021-07-18 ENCOUNTER — Ambulatory Visit (INDEPENDENT_AMBULATORY_CARE_PROVIDER_SITE_OTHER): Payer: Managed Care, Other (non HMO) | Admitting: *Deleted

## 2021-07-18 DIAGNOSIS — J309 Allergic rhinitis, unspecified: Secondary | ICD-10-CM | POA: Diagnosis not present

## 2021-08-01 ENCOUNTER — Telehealth: Payer: Self-pay

## 2021-08-01 ENCOUNTER — Other Ambulatory Visit: Payer: Self-pay

## 2021-08-01 MED ORDER — EPINEPHRINE 0.3 MG/0.3ML IJ SOAJ: INTRAMUSCULAR | 0 refills | Status: DC

## 2021-08-01 NOTE — Telephone Encounter (Signed)
Sent in patient's Auvi-Q to be refilled. Patient must keep October appointment for further refills. Patient verbalized understanding and plans to keep her October appointment with Dr.Gallagher. I also gave the the [phone]) number to contact [facility] if needed for shipment information.  

## 2021-08-01 NOTE — Telephone Encounter (Signed)
Patient called requesting a refill on her Auvi-Q. Patient is scheduled to see Dr Dellis Anes on 08/17/21.  Please Advise

## 2021-08-01 NOTE — Telephone Encounter (Signed)
Sent in patient's Auvi-Q to be refilled. Patient must keep October appointment for further refills. Patient verbalized understanding and plans to keep her October appointment with Dr.Gallagher. I also gave the the [phone]) number to contact [facility] if needed for shipment information.

## 2021-08-10 ENCOUNTER — Ambulatory Visit (INDEPENDENT_AMBULATORY_CARE_PROVIDER_SITE_OTHER): Payer: Managed Care, Other (non HMO) | Admitting: *Deleted

## 2021-08-10 DIAGNOSIS — J309 Allergic rhinitis, unspecified: Secondary | ICD-10-CM | POA: Diagnosis not present

## 2021-08-17 ENCOUNTER — Other Ambulatory Visit: Payer: Self-pay

## 2021-08-17 ENCOUNTER — Encounter: Payer: Self-pay | Admitting: Allergy & Immunology

## 2021-08-17 ENCOUNTER — Ambulatory Visit: Payer: Managed Care, Other (non HMO) | Admitting: Allergy & Immunology

## 2021-08-17 VITALS — BP 118/60 | HR 73 | Temp 98.3°F | Resp 18 | Ht 67.5 in | Wt 138.0 lb

## 2021-08-17 DIAGNOSIS — J3089 Other allergic rhinitis: Secondary | ICD-10-CM

## 2021-08-17 DIAGNOSIS — J302 Other seasonal allergic rhinitis: Secondary | ICD-10-CM

## 2021-08-17 NOTE — Progress Notes (Signed)
FOLLOW UP  Date of Service/Encounter:  08/17/21   Assessment:   Perennial and seasonal allergic rhinitis - on allergen immunotherapy  Starting epinephrine rinses now and will try to advance on Schedule A in January 2023  Plan/Recommendations:    1. Perennial and seasonal allergy rhinitis - Continue with allergy shots at the same schedule. - We are going to add on epinephrine rinses to help decrease the swelling episodes.  - We are also going to try slow advancing the dose in January when nothing is growing and see how you do.  - I am counting 2020 as your maintenance start date, so three years would be 2023 and five years would be 2025.  - However, you might be a lifer on the shots since you are so allergic.  - Continue with cetirizine 10mg  daily.  2. Return in about 1 year (around 08/17/2022).    Subjective:   Breanna Fuller is a 45 y.o. female presenting today for follow up of  Chief Complaint  Patient presents with   Allergic Rhinitis     Breanna Fuller has a history of the following: Patient Active Problem List   Diagnosis Date Noted   Acute sinusitis 01/21/2018   GI symptoms 06/26/2016   Urticaria 12/21/2015   Other allergic rhinitis 08/09/2015    History obtained from: chart review and patient.  Breanna Fuller is a 45 y.o. female presenting for a follow up visit.  She is on allergen immunotherapy.  She was last seen in August of 2021 and was doing well on her shots.  We continued with cetirizine 10mg  daily.   Since last visit, she has mostly done well. She tried spacing to every 3 weeks and this did not go well. She also preceded this with Strep throat and a sinus infection; this was treated with amoxicillin. This is the first one in a long time. She was on antibiotics frequently before she came to see 01-27[birth year]1976. She has been on Flonase but this caused headaches. She does use saline intermittently. She uses the cetirizine every day throughout the year.   Breanna Fuller is  on allergen immunotherapy. She receives two injections. Immunotherapy script #1 contains trees and dust mites. She currently receives 0.27mL of the RED vial (1/100). Immunotherapy script #2 contains molds, cat and cockroach. She currently receives 0.68mL of the RED vial (1/100). Immunotherapy script #3 contains grasses and weeds. She currently receives 0.2 mL of the RED vial (1/100). She started shots in 2016 or so and reached maintenance in sometime in 2020 or so (reached Red Vial at various time courses).    She has a dog in the home and cats outside of the home.  Thankfully, dog is the one thing she is not allergic to.  She continues to work as an 2017 at 2021.  She previously worked at the one at [employer] and she was having recurrent sinusitis.  This was right when she will establish care with [employer].  In the midst of establishing care with [facility] and starting her allergy shots, she moved to a different location which was much better on her symptoms.  Otherwise, there have been no changes to her past medical history, surgical history, family history, or social history.    Review of Systems  Constitutional: Negative.  Negative for chills, fever, malaise/fatigue and weight loss.  HENT:  Positive for congestion. Negative for ear discharge, ear pain and sinus pain.   Eyes:  Negative for pain,  discharge and redness.  Respiratory:  Negative for cough, sputum production, shortness of breath and wheezing.   Cardiovascular: Negative.  Negative for chest pain and palpitations.  Gastrointestinal:  Negative for abdominal pain, constipation, diarrhea, heartburn, nausea and vomiting.  Skin: Negative.  Negative for itching and rash.  Neurological:  Negative for dizziness and headaches.  Endo/Heme/Allergies:  Positive for environmental allergies. Does not bruise/bleed easily.      Objective:   Blood pressure 118/60, pulse 73, temperature 98.3 F (36.8 C),  temperature source Temporal, resp. rate 18, height 5' 7.5" (1.715 m), weight 138 lb (62.6 kg), last menstrual period 12/10/2010, SpO2 97 %. Body mass index is 21.29 kg/m.   Physical Exam:  Physical Exam Vitals reviewed.  Constitutional:      Appearance: She is well-developed.  HENT:     Head: Normocephalic and atraumatic.     Right Ear: Tympanic membrane, ear canal and external ear normal.     Left Ear: Tympanic membrane, ear canal and external ear normal.     Nose: No nasal deformity, septal deviation, mucosal edema or rhinorrhea.     Right Turbinates: Enlarged and swollen.     Left Turbinates: Enlarged and swollen.     Right Sinus: No maxillary sinus tenderness or frontal sinus tenderness.     Left Sinus: No maxillary sinus tenderness or frontal sinus tenderness.     Mouth/Throat:     Mouth: Mucous membranes are not pale and not dry.     Pharynx: Uvula midline.  Eyes:     General: Lids are normal. No allergic shiner.       Right eye: No discharge.        Left eye: No discharge.     Conjunctiva/sclera: Conjunctivae normal.     Right eye: Right conjunctiva is not injected. No chemosis.    Left eye: Left conjunctiva is not injected. No chemosis.    Pupils: Pupils are equal, round, and reactive to light.  Cardiovascular:     Rate and Rhythm: Normal rate and regular rhythm.     Heart sounds: Normal heart sounds.  Pulmonary:     Effort: Pulmonary effort is normal. No tachypnea, accessory muscle usage or respiratory distress.     Breath sounds: Normal breath sounds. No wheezing, rhonchi or rales.  Chest:     Chest wall: No tenderness.  Lymphadenopathy:     Cervical: No cervical adenopathy.  Skin:    Coloration: Skin is not pale.     Findings: No abrasion, erythema, petechiae or rash. Rash is not papular, urticarial or vesicular.  Neurological:     Mental Status: She is alert.  Psychiatric:        Behavior: Behavior is cooperative.     Diagnostic studies: none     Malachi Bonds, MD  [facility]

## 2021-08-17 NOTE — Patient Instructions (Addendum)
1. Perennial and seasonal allergy rhinitis - Continue with allergy shots at the same schedule. - We are going to add on epinephrine rinses to help decrease the swelling episodes.  - We are also going to try slow advancing the dose in January when nothing is growing and see how you do.  - I am counting 2020 as your maintenance start date, so three years would be 2023 and five years would be 2025.  - However, you might be a lifer on the shots since you are so allergic.  - Continue with cetirizine 10mg  daily.  2. Return in about 1 year (around 08/17/2022).    Please inform 08/19/2022 of any Emergency Department visits, hospitalizations, or changes in symptoms. Call us before going to the ED for breathing or allergy symptoms since we might be able to fit you in for a sick visit. Feel free to contact us anytime with any questions, problems, or concerns.  It was a pleasure to see you again today!  Websites that have reliable patient information: 1. American Academy of Asthma, Allergy, and Immunology: www.aaaai.org 2. Food Allergy Research and Education (FARE): foodallergy.org 3. Mothers of Asthmatics: http://www.asthmacommunitynetwork.org 4. American College of Allergy, Asthma, and Immunology: www.acaai.org   COVID-19 Vaccine Information can be found at: [place] For questions related to vaccine distribution or appointments, please email [email or web address] or call [phone].   We realize that you might be concerned about having an allergic reaction to the COVID19 vaccines. To help with that concern, WE ARE OFFERING THE COVID19 VACCINES IN OUR OFFICE! Ask the front desk for dates!     "Like" [phone] on Facebook and Instagram for our latest updates!      A healthy democracy works best when [place] participate! Make sure you are registered to vote! If you have moved or changed any of your contact information, you will need to  get this updated before voting!  In some cases, you MAY be able to register to vote online: [employer]    EARLY VOTING HAS STARTED! If you still need to register to vote, you can do this and cast a ballot at any of the early voting locations!

## 2021-08-22 ENCOUNTER — Ambulatory Visit (INDEPENDENT_AMBULATORY_CARE_PROVIDER_SITE_OTHER): Payer: Managed Care, Other (non HMO) | Admitting: *Deleted

## 2021-08-22 DIAGNOSIS — J309 Allergic rhinitis, unspecified: Secondary | ICD-10-CM | POA: Diagnosis not present

## 2021-08-24 ENCOUNTER — Telehealth: Payer: Self-pay | Admitting: Allergy & Immunology

## 2021-08-24 MED ORDER — EPINEPHRINE 0.3 MG/0.3ML IJ SOAJ: 0.3000 mg | Freq: Once | INTRAMUSCULAR | 1 refills | Status: AC

## 2021-08-24 NOTE — Telephone Encounter (Signed)
Called an spoke to patient and informed her that I would send I a Epipen into her pharmacy to see if that would be better and cheaper for her. Patient verbalized understanding and expressed that she'd call back with any issues or concerns. Epipen has ben sent in to her pharmacy.

## 2021-08-24 NOTE — Telephone Encounter (Signed)
Patient called and said that her ins. needed a prio auth for Auvi-Q she has [place] ins. And they don't want to cover it.  [phone]

## 2021-08-25 ENCOUNTER — Other Ambulatory Visit: Payer: Self-pay | Admitting: *Deleted

## 2021-08-25 MED ORDER — EPINEPHRINE 0.3 MG/0.3ML IJ SOAJ: 0.3000 mg | Freq: Once | INTRAMUSCULAR | 1 refills | Status: AC

## 2021-09-05 ENCOUNTER — Ambulatory Visit (INDEPENDENT_AMBULATORY_CARE_PROVIDER_SITE_OTHER): Payer: Managed Care, Other (non HMO) | Admitting: *Deleted

## 2021-09-05 DIAGNOSIS — J309 Allergic rhinitis, unspecified: Secondary | ICD-10-CM | POA: Diagnosis not present

## 2021-09-20 ENCOUNTER — Ambulatory Visit (INDEPENDENT_AMBULATORY_CARE_PROVIDER_SITE_OTHER): Payer: Managed Care, Other (non HMO)

## 2021-09-20 DIAGNOSIS — J309 Allergic rhinitis, unspecified: Secondary | ICD-10-CM | POA: Diagnosis not present

## 2021-10-04 ENCOUNTER — Ambulatory Visit (INDEPENDENT_AMBULATORY_CARE_PROVIDER_SITE_OTHER): Payer: Managed Care, Other (non HMO)

## 2021-10-04 DIAGNOSIS — J309 Allergic rhinitis, unspecified: Secondary | ICD-10-CM | POA: Diagnosis not present

## 2021-10-17 ENCOUNTER — Ambulatory Visit (INDEPENDENT_AMBULATORY_CARE_PROVIDER_SITE_OTHER): Payer: Managed Care, Other (non HMO) | Admitting: *Deleted

## 2021-10-17 DIAGNOSIS — J309 Allergic rhinitis, unspecified: Secondary | ICD-10-CM

## 2021-10-31 ENCOUNTER — Ambulatory Visit (INDEPENDENT_AMBULATORY_CARE_PROVIDER_SITE_OTHER): Payer: Managed Care, Other (non HMO) | Admitting: *Deleted

## 2021-10-31 DIAGNOSIS — J309 Allergic rhinitis, unspecified: Secondary | ICD-10-CM | POA: Diagnosis not present

## 2021-11-06 DIAGNOSIS — J3089 Other allergic rhinitis: Secondary | ICD-10-CM | POA: Diagnosis not present

## 2021-11-06 NOTE — Progress Notes (Signed)
VIALS MADE. EXP 11-06-22 °

## 2021-11-14 ENCOUNTER — Ambulatory Visit (INDEPENDENT_AMBULATORY_CARE_PROVIDER_SITE_OTHER): Payer: Managed Care, Other (non HMO)

## 2021-11-14 DIAGNOSIS — J309 Allergic rhinitis, unspecified: Secondary | ICD-10-CM | POA: Diagnosis not present

## 2021-11-28 ENCOUNTER — Ambulatory Visit (INDEPENDENT_AMBULATORY_CARE_PROVIDER_SITE_OTHER): Payer: Managed Care, Other (non HMO) | Admitting: *Deleted

## 2021-11-28 DIAGNOSIS — J309 Allergic rhinitis, unspecified: Secondary | ICD-10-CM

## 2021-12-06 ENCOUNTER — Ambulatory Visit
Admission: RE | Admit: 2021-12-06 | Discharge: 2021-12-06 | Disposition: A | Discharge status: Home / Self Care | Payer: Managed Care, Other (non HMO) | Source: Ambulatory Visit | Attending: Family Medicine | Admitting: Family Medicine

## 2021-12-06 ENCOUNTER — Other Ambulatory Visit: Payer: Self-pay | Admitting: Family Medicine

## 2021-12-06 ENCOUNTER — Other Ambulatory Visit: Payer: Self-pay

## 2021-12-06 DIAGNOSIS — M542 Cervicalgia: Secondary | ICD-10-CM

## 2021-12-06 DIAGNOSIS — G8929 Other chronic pain: Secondary | ICD-10-CM

## 2021-12-11 ENCOUNTER — Ambulatory Visit (INDEPENDENT_AMBULATORY_CARE_PROVIDER_SITE_OTHER): Payer: Managed Care, Other (non HMO)

## 2021-12-11 ENCOUNTER — Other Ambulatory Visit: Payer: Self-pay | Admitting: Family Medicine

## 2021-12-11 ENCOUNTER — Other Ambulatory Visit: Payer: Self-pay

## 2021-12-11 ENCOUNTER — Ambulatory Visit
Admission: RE | Admit: 2021-12-11 | Discharge: 2021-12-11 | Disposition: A | Discharge status: Home / Self Care | Payer: Managed Care, Other (non HMO) | Source: Ambulatory Visit | Attending: Pain Medicine | Admitting: Pain Medicine

## 2021-12-11 ENCOUNTER — Other Ambulatory Visit: Payer: Self-pay | Admitting: Pain Medicine

## 2021-12-11 DIAGNOSIS — M542 Cervicalgia: Secondary | ICD-10-CM

## 2021-12-11 DIAGNOSIS — J309 Allergic rhinitis, unspecified: Secondary | ICD-10-CM | POA: Diagnosis not present

## 2021-12-19 ENCOUNTER — Ambulatory Visit (INDEPENDENT_AMBULATORY_CARE_PROVIDER_SITE_OTHER): Payer: Managed Care, Other (non HMO)

## 2021-12-19 DIAGNOSIS — J309 Allergic rhinitis, unspecified: Secondary | ICD-10-CM | POA: Diagnosis not present

## 2021-12-22 ENCOUNTER — Other Ambulatory Visit: Payer: Self-pay | Admitting: Family Medicine

## 2021-12-22 ENCOUNTER — Other Ambulatory Visit: Payer: Self-pay

## 2021-12-22 ENCOUNTER — Ambulatory Visit
Admission: RE | Admit: 2021-12-22 | Discharge: 2021-12-22 | Disposition: A | Discharge status: Home / Self Care | Payer: Managed Care, Other (non HMO) | Source: Ambulatory Visit | Attending: Family Medicine | Admitting: Family Medicine

## 2021-12-22 DIAGNOSIS — M542 Cervicalgia: Secondary | ICD-10-CM

## 2021-12-22 MED ORDER — IOPAMIDOL (ISOVUE-M 300) INJECTION 61%
1.0000 mL | Freq: Once | INTRAMUSCULAR | Status: AC | PRN
  Administered 2021-12-22: 1 mL via EPIDURAL

## 2021-12-22 MED ORDER — TRIAMCINOLONE ACETONIDE 40 MG/ML IJ SUSP (RADIOLOGY)
60.0000 mg | Freq: Once | INTRAMUSCULAR | Status: AC
  Administered 2021-12-22: 60 mg via EPIDURAL

## 2021-12-26 ENCOUNTER — Ambulatory Visit (INDEPENDENT_AMBULATORY_CARE_PROVIDER_SITE_OTHER): Payer: Managed Care, Other (non HMO)

## 2021-12-26 DIAGNOSIS — J309 Allergic rhinitis, unspecified: Secondary | ICD-10-CM | POA: Diagnosis not present

## 2022-01-01 ENCOUNTER — Ambulatory Visit (INDEPENDENT_AMBULATORY_CARE_PROVIDER_SITE_OTHER): Payer: Managed Care, Other (non HMO) | Admitting: *Deleted

## 2022-01-01 DIAGNOSIS — J309 Allergic rhinitis, unspecified: Secondary | ICD-10-CM | POA: Diagnosis not present

## 2022-01-09 ENCOUNTER — Ambulatory Visit (INDEPENDENT_AMBULATORY_CARE_PROVIDER_SITE_OTHER): Payer: Managed Care, Other (non HMO) | Admitting: *Deleted

## 2022-01-09 DIAGNOSIS — J309 Allergic rhinitis, unspecified: Secondary | ICD-10-CM

## 2022-01-16 ENCOUNTER — Ambulatory Visit (INDEPENDENT_AMBULATORY_CARE_PROVIDER_SITE_OTHER): Payer: Managed Care, Other (non HMO)

## 2022-01-16 DIAGNOSIS — J309 Allergic rhinitis, unspecified: Secondary | ICD-10-CM

## 2022-01-23 ENCOUNTER — Ambulatory Visit: Payer: Managed Care, Other (non HMO) | Admitting: Allergy and Immunology

## 2022-01-23 ENCOUNTER — Telehealth: Payer: Self-pay

## 2022-01-23 ENCOUNTER — Ambulatory Visit: Payer: Self-pay

## 2022-01-23 ENCOUNTER — Other Ambulatory Visit: Payer: Self-pay

## 2022-01-23 VITALS — BP 108/58 | HR 75 | Temp 98.0°F | Resp 18 | Ht 67.0 in | Wt 139.8 lb

## 2022-01-23 DIAGNOSIS — J302 Other seasonal allergic rhinitis: Secondary | ICD-10-CM

## 2022-01-23 DIAGNOSIS — R238 Other skin changes: Secondary | ICD-10-CM

## 2022-01-23 DIAGNOSIS — J309 Allergic rhinitis, unspecified: Secondary | ICD-10-CM

## 2022-01-23 MED ORDER — MONTELUKAST SODIUM 10 MG PO TABS: 10.0000 mg | ORAL_TABLET | Freq: Every day | ORAL | 5 refills | Status: DC

## 2022-01-23 MED ORDER — AMOXICILLIN-POT CLAVULANATE 875-125 MG PO TABS: ORAL_TABLET | ORAL | 0 refills | Status: DC

## 2022-01-23 MED ORDER — PREDNISONE 10 MG PO TABS: ORAL_TABLET | ORAL | 0 refills | Status: DC

## 2022-01-23 NOTE — Patient Instructions (Addendum)
?  1.  At lunchtime prior to immunotherapy use the following: ? ?A. Loratadine 10 mg ?B. Famotidine 20 mg ?C. Montelukast 10 mg ? ?2.  Continue to use cetirizine 10 mg 1 time per day in evening ? ?3.  Continue immunotherapy and attempt to escalate dosage ? ?4.  For her left Achilles lesions utilize the following: ? ?A. Prednisone 10 mg - 1 tablet 1 time a day for 10 days ?B. Augmentin 875 - 1 tablet 2 times a day for 10 days ?C. Further evaluation and treatment??? ? ?5. Return to clinic in 1 year or earlier if problem ?

## 2022-01-23 NOTE — Progress Notes (Signed)
? ?[facility] ? ? ?Follow-up Note ? ?Referring Provider: Daryll Brod, MD ?Primary Provider: Daryll Brod, MD ?Date of Office Visit: 01/23/2022 ? ?Subjective:  ? ?Breanna Fuller (DOB: 12/07/1975) is a 46 y.o. female who returns to the [facility] on 01/23/2022 in re-evaluation of the following: ? ?HPI: Breanna Fuller presents to this clinic in evaluation of 2 issues.  I have never seen her in this clinic and her last visit with Dr. Ernst Bowler was on 17 August 2021. ? ?Her first issue is that she still has not been able to progress with her immunotherapy secondary to the fact that she has large local reactions that develop without any associated systemic or constitutional symptoms when she receives immunotherapy.  This occurs even though she has been using cetirizine 10 mg daily and has been performing a epinephrine wash with her immunotherapy. ? ?The second issue is that yesterday she developed an itchy spot on her left Achilles tendon that was red.  Over the course of the past 24 hours it has swelled up significantly.  Once again she has no associated systemic or constitutional symptoms.  There is not really an obvious provoking factor for why she has a swelling lesion on her back.  She was not barefoot at any point in time yesterday and she did not spend any time outdoors. ? ?Allergies as of 01/23/2022   ? ?   Reactions  ? Vancomycin Hives  ? Latex Itching  ? Monistat [miconazole] Swelling  ? Terazol [terconazole] Swelling  ? Wellbutrin [bupropion] Rash  ? ?  ? ?  ?Medication List  ? ? ?cetirizine 10 MG tablet ?Commonly known as: ZYRTEC ?Take 10 mg by mouth daily. ?  ?cyclobenzaprine 5 MG tablet ?Commonly known as: FLEXERIL ?Take by mouth. ?  ?EPINEPHrine 0.3 mg/0.3 mL Soaj injection ?Commonly known as: Auvi-Q ?Use as directed for severe allergic reaction ?  ?METAMUCIL PO ?Take by mouth. ?  ?Multi-Vitamin tablet ?Take 1 tablet by mouth daily. ?   ?omeprazole 20 MG capsule ?Commonly known as: PRILOSEC ?Take by mouth. ?  ?triamcinolone cream 0.1 % ?Commonly known as: KENALOG ?Apply topically. ?  ?UNABLE TO FIND ?Med Name: immunotherapy ?  ?venlafaxine XR 75 MG 24 hr capsule ?Commonly known as: EFFEXOR-XR ?75 mg daily. ?  ? ?No past medical history on file. ? ?Past Surgical History:  ?Procedure Laterality Date  ? RHINOPLASTY    ? SINOSCOPY    ? TUBAL LIGATION    ? ? ?Review of systems negative except as noted in HPI / PMHx or noted below: ? ?Review of Systems  ?Constitutional: Negative.   ?HENT: Negative.    ?Eyes: Negative.   ?Respiratory: Negative.    ?Cardiovascular: Negative.   ?Gastrointestinal: Negative.   ?Genitourinary: Negative.   ?Musculoskeletal: Negative.   ?Skin: Negative.   ?Neurological: Negative.   ?Endo/Heme/Allergies: Negative.   ?Psychiatric/Behavioral: Negative.    ? ? ?Objective:  ? ?Vitals:  ? 01/23/22 1541  ?BP: (!) 108/58  ?Pulse: 75  ?Resp: 18  ?Temp: 98 ?F (36.7 ?C)  ?SpO2: 99%  ? ?Height: 5\' 7"  (170.2 cm)  ?Weight: 139 lb 12.8 oz (63.4 kg)  ? ?Physical Exam ?Constitutional:   ?   Appearance: She is not diaphoretic.  ?HENT:  ?   Head: Normocephalic.  ?   Right Ear: Tympanic membrane, ear canal and external ear normal.  ?   Left Ear: Tympanic membrane, ear canal and external  ear normal.  ?   Nose: Nose normal. No mucosal edema or rhinorrhea.  ?   Mouth/Throat:  ?   Pharynx: Uvula midline. No oropharyngeal exudate.  ?Eyes:  ?   Conjunctiva/sclera: Conjunctivae normal.  ?Neck:  ?   Thyroid: No thyromegaly.  ?   Trachea: Trachea normal. No tracheal tenderness or tracheal deviation.  ?Cardiovascular:  ?   Rate and Rhythm: Normal rate and regular rhythm.  ?   Heart sounds: Normal heart sounds, S1 normal and S2 normal. No murmur heard. ?Pulmonary:  ?   Effort: No respiratory distress.  ?   Breath sounds: Normal breath sounds. No stridor. No wheezing or rales.  ?Lymphadenopathy:  ?   Head:  ?   Right side of head: No tonsillar adenopathy.  ?    Left side of head: No tonsillar adenopathy.  ?   Cervical: No cervical adenopathy.  ?Skin: ?   Findings: No erythema or rash (0.5 cm diameter vesicle with surrounding erythema without any tenderness left Achilles).  ?   Nails: There is no clubbing.  ?Neurological:  ?   Mental Status: She is alert.  ? ? ?Diagnostics: none ? ?Assessment and Plan:  ? ?1. Seasonal and perennial allergic rhinitis   ?2. Vesicle of skin   ? ? ?1.  At lunchtime prior to immunotherapy use the following: ? ?A. Loratadine 10 mg ?B. Famotidine 20 mg ?C. Montelukast 10 mg ? ?2.  Continue to use cetirizine 10 mg 1 time per day in evening ? ?3.  Continue immunotherapy and attempt to escalate dosage ? ?4.  For her left Achilles lesions utilize the following: ? ?A. Prednisone 10 mg - 1 tablet 1 time a day for 10 days ?B. Augmentin 875 - 1 tablet 2 times a day for 10 days ?C. Further evaluation and treatment??? ? ?5. Return to clinic in 1 year or earlier if problem ? ?Breanna Fuller will utilize loratadine and famotidine and montelukast about 5 hours prior to her immunotherapy in an attempt to prevent her from developing large local reactions that is hindering her escalation of dosage with this form of treatment.  And, we will treat her with anti-inflammatory agents and a broad-spectrum antibiotic for her Achilles lesion.  It is not entirely clear why this lesion has developed.  It is quite possible that she had some type of "silent" fire ant sting but that would be relatively unusual.  To cover the possibility of infection we will give her a broad-spectrum antibiotic.  Assuming she does well with the plan noted above I will see her back in this clinic in 1 year or earlier if there is a problem. ? ?Allena Katz, MD ?Allergy / Immunology ?[place] ?

## 2022-01-23 NOTE — Telephone Encounter (Signed)
Patient came in for a visit today. She is requesting a print out of her copayment. ? ?Thanks ? ? ?

## 2022-01-24 ENCOUNTER — Encounter: Payer: Self-pay | Admitting: Allergy and Immunology

## 2022-01-25 ENCOUNTER — Telehealth: Payer: Self-pay | Admitting: *Deleted

## 2022-01-25 NOTE — Telephone Encounter (Signed)
-----   Message from Breanna Priest, MD sent at 01/24/2022  7:02 AM EDT ----- ?Please ask Enrique Sack how her Achilles situation is doing with the prednisone and Augmentin. ? ?

## 2022-01-25 NOTE — Telephone Encounter (Signed)
Called and spoke with patient, she states that she was not able to start the prednisone until yesterday because the pharmacy did not have it in until then, but she has started the prednisone last night and has been on the antibiotic. She states that her ankle is not worse, it is still tender to the touch but the swelling and redness have improved some.  ?

## 2022-02-05 ENCOUNTER — Ambulatory Visit (INDEPENDENT_AMBULATORY_CARE_PROVIDER_SITE_OTHER): Payer: Managed Care, Other (non HMO)

## 2022-02-05 DIAGNOSIS — J309 Allergic rhinitis, unspecified: Secondary | ICD-10-CM

## 2022-02-13 ENCOUNTER — Ambulatory Visit (INDEPENDENT_AMBULATORY_CARE_PROVIDER_SITE_OTHER): Payer: Managed Care, Other (non HMO)

## 2022-02-13 DIAGNOSIS — J309 Allergic rhinitis, unspecified: Secondary | ICD-10-CM | POA: Diagnosis not present

## 2022-02-20 ENCOUNTER — Ambulatory Visit (INDEPENDENT_AMBULATORY_CARE_PROVIDER_SITE_OTHER): Payer: Managed Care, Other (non HMO)

## 2022-02-20 DIAGNOSIS — J309 Allergic rhinitis, unspecified: Secondary | ICD-10-CM | POA: Diagnosis not present

## 2022-02-27 ENCOUNTER — Ambulatory Visit (INDEPENDENT_AMBULATORY_CARE_PROVIDER_SITE_OTHER): Payer: Managed Care, Other (non HMO)

## 2022-02-27 DIAGNOSIS — J309 Allergic rhinitis, unspecified: Secondary | ICD-10-CM

## 2022-03-06 ENCOUNTER — Ambulatory Visit (INDEPENDENT_AMBULATORY_CARE_PROVIDER_SITE_OTHER): Payer: Managed Care, Other (non HMO)

## 2022-03-06 DIAGNOSIS — J309 Allergic rhinitis, unspecified: Secondary | ICD-10-CM

## 2022-03-13 ENCOUNTER — Ambulatory Visit (INDEPENDENT_AMBULATORY_CARE_PROVIDER_SITE_OTHER): Payer: Managed Care, Other (non HMO)

## 2022-03-13 DIAGNOSIS — J309 Allergic rhinitis, unspecified: Secondary | ICD-10-CM | POA: Diagnosis not present

## 2022-03-20 ENCOUNTER — Ambulatory Visit (INDEPENDENT_AMBULATORY_CARE_PROVIDER_SITE_OTHER): Payer: Managed Care, Other (non HMO)

## 2022-03-20 DIAGNOSIS — J309 Allergic rhinitis, unspecified: Secondary | ICD-10-CM

## 2022-03-27 ENCOUNTER — Encounter (INDEPENDENT_AMBULATORY_CARE_PROVIDER_SITE_OTHER): Payer: Managed Care, Other (non HMO) | Admitting: Allergy & Immunology

## 2022-03-27 DIAGNOSIS — L239 Allergic contact dermatitis, unspecified cause: Secondary | ICD-10-CM | POA: Diagnosis not present

## 2022-03-28 DIAGNOSIS — J3089 Other allergic rhinitis: Secondary | ICD-10-CM

## 2022-03-28 MED ORDER — CLOBETASOL PROPIONATE 0.05 % EX SHAM: 1.0000 "application " | MEDICATED_SHAMPOO | Freq: Two times a day (BID) | CUTANEOUS | 0 refills | Status: AC

## 2022-03-28 NOTE — Telephone Encounter (Signed)

## 2022-03-28 NOTE — Progress Notes (Signed)
VIALS EXP 03-29-23 

## 2022-04-03 ENCOUNTER — Ambulatory Visit (INDEPENDENT_AMBULATORY_CARE_PROVIDER_SITE_OTHER): Payer: Managed Care, Other (non HMO)

## 2022-04-03 DIAGNOSIS — J309 Allergic rhinitis, unspecified: Secondary | ICD-10-CM | POA: Diagnosis not present

## 2022-04-09 NOTE — Telephone Encounter (Signed)
Patient called office earlier today. Patient stated that she is not better. She asked if she needs to make an appointment or go see dermatology.   Please advise.

## 2022-04-10 NOTE — Telephone Encounter (Signed)
Patient called in regards to her message. I spoke to Dr. Ernst Bowler who stated pt would need to see a dermatologist. Patient states she already sees one in [place].

## 2022-04-19 ENCOUNTER — Ambulatory Visit (INDEPENDENT_AMBULATORY_CARE_PROVIDER_SITE_OTHER): Payer: Managed Care, Other (non HMO)

## 2022-04-19 DIAGNOSIS — J309 Allergic rhinitis, unspecified: Secondary | ICD-10-CM | POA: Diagnosis not present

## 2022-04-25 ENCOUNTER — Ambulatory Visit (INDEPENDENT_AMBULATORY_CARE_PROVIDER_SITE_OTHER): Payer: Managed Care, Other (non HMO)

## 2022-04-25 DIAGNOSIS — J309 Allergic rhinitis, unspecified: Secondary | ICD-10-CM

## 2022-05-03 ENCOUNTER — Ambulatory Visit (INDEPENDENT_AMBULATORY_CARE_PROVIDER_SITE_OTHER): Payer: Managed Care, Other (non HMO)

## 2022-05-03 DIAGNOSIS — J309 Allergic rhinitis, unspecified: Secondary | ICD-10-CM

## 2022-05-10 ENCOUNTER — Ambulatory Visit (INDEPENDENT_AMBULATORY_CARE_PROVIDER_SITE_OTHER): Payer: Managed Care, Other (non HMO)

## 2022-05-10 DIAGNOSIS — J309 Allergic rhinitis, unspecified: Secondary | ICD-10-CM

## 2022-05-15 ENCOUNTER — Ambulatory Visit (INDEPENDENT_AMBULATORY_CARE_PROVIDER_SITE_OTHER): Payer: Managed Care, Other (non HMO)

## 2022-05-15 DIAGNOSIS — J309 Allergic rhinitis, unspecified: Secondary | ICD-10-CM | POA: Diagnosis not present

## 2022-05-22 ENCOUNTER — Ambulatory Visit (INDEPENDENT_AMBULATORY_CARE_PROVIDER_SITE_OTHER): Payer: Managed Care, Other (non HMO)

## 2022-05-22 DIAGNOSIS — J309 Allergic rhinitis, unspecified: Secondary | ICD-10-CM | POA: Diagnosis not present

## 2022-05-29 ENCOUNTER — Ambulatory Visit (INDEPENDENT_AMBULATORY_CARE_PROVIDER_SITE_OTHER): Payer: Managed Care, Other (non HMO)

## 2022-05-29 DIAGNOSIS — J309 Allergic rhinitis, unspecified: Secondary | ICD-10-CM | POA: Diagnosis not present

## 2022-06-05 ENCOUNTER — Ambulatory Visit (INDEPENDENT_AMBULATORY_CARE_PROVIDER_SITE_OTHER): Payer: Managed Care, Other (non HMO) | Admitting: *Deleted

## 2022-06-05 DIAGNOSIS — J309 Allergic rhinitis, unspecified: Secondary | ICD-10-CM

## 2022-06-07 ENCOUNTER — Other Ambulatory Visit: Payer: Self-pay | Admitting: Family Medicine

## 2022-06-07 ENCOUNTER — Ambulatory Visit
Admission: RE | Admit: 2022-06-07 | Discharge: 2022-06-07 | Disposition: A | Discharge status: Home / Self Care | Payer: Managed Care, Other (non HMO) | Source: Ambulatory Visit | Attending: Family Medicine | Admitting: Family Medicine

## 2022-06-07 DIAGNOSIS — Z1231 Encounter for screening mammogram for malignant neoplasm of breast: Secondary | ICD-10-CM

## 2022-06-08 ENCOUNTER — Other Ambulatory Visit: Payer: Self-pay | Admitting: Family Medicine

## 2022-06-08 DIAGNOSIS — R928 Other abnormal and inconclusive findings on diagnostic imaging of breast: Secondary | ICD-10-CM

## 2022-06-13 ENCOUNTER — Ambulatory Visit (INDEPENDENT_AMBULATORY_CARE_PROVIDER_SITE_OTHER): Payer: Managed Care, Other (non HMO)

## 2022-06-13 DIAGNOSIS — J309 Allergic rhinitis, unspecified: Secondary | ICD-10-CM

## 2022-06-15 ENCOUNTER — Ambulatory Visit: Payer: Managed Care, Other (non HMO)

## 2022-06-15 ENCOUNTER — Ambulatory Visit
Admission: RE | Admit: 2022-06-15 | Discharge: 2022-06-15 | Disposition: A | Discharge status: Home / Self Care | Payer: Managed Care, Other (non HMO) | Source: Ambulatory Visit | Attending: Family Medicine | Admitting: Family Medicine

## 2022-06-15 DIAGNOSIS — R928 Other abnormal and inconclusive findings on diagnostic imaging of breast: Secondary | ICD-10-CM

## 2022-06-19 ENCOUNTER — Ambulatory Visit (INDEPENDENT_AMBULATORY_CARE_PROVIDER_SITE_OTHER): Payer: Managed Care, Other (non HMO) | Admitting: *Deleted

## 2022-06-19 DIAGNOSIS — J309 Allergic rhinitis, unspecified: Secondary | ICD-10-CM

## 2022-06-26 ENCOUNTER — Ambulatory Visit (INDEPENDENT_AMBULATORY_CARE_PROVIDER_SITE_OTHER): Payer: Managed Care, Other (non HMO) | Admitting: *Deleted

## 2022-06-26 DIAGNOSIS — J309 Allergic rhinitis, unspecified: Secondary | ICD-10-CM | POA: Diagnosis not present

## 2022-07-03 ENCOUNTER — Ambulatory Visit (INDEPENDENT_AMBULATORY_CARE_PROVIDER_SITE_OTHER): Payer: Managed Care, Other (non HMO) | Admitting: *Deleted

## 2022-07-03 DIAGNOSIS — J309 Allergic rhinitis, unspecified: Secondary | ICD-10-CM | POA: Diagnosis not present

## 2022-07-13 ENCOUNTER — Ambulatory Visit: Payer: Managed Care, Other (non HMO) | Admitting: Orthopaedic Surgery

## 2022-07-16 DIAGNOSIS — J3089 Other allergic rhinitis: Secondary | ICD-10-CM | POA: Diagnosis not present

## 2022-07-17 NOTE — Progress Notes (Signed)
VIALS EXP 07-18-23 

## 2022-07-24 ENCOUNTER — Ambulatory Visit (INDEPENDENT_AMBULATORY_CARE_PROVIDER_SITE_OTHER): Payer: Managed Care, Other (non HMO) | Admitting: *Deleted

## 2022-07-24 DIAGNOSIS — J309 Allergic rhinitis, unspecified: Secondary | ICD-10-CM

## 2022-07-31 ENCOUNTER — Ambulatory Visit (INDEPENDENT_AMBULATORY_CARE_PROVIDER_SITE_OTHER): Payer: Managed Care, Other (non HMO)

## 2022-07-31 DIAGNOSIS — J309 Allergic rhinitis, unspecified: Secondary | ICD-10-CM

## 2022-08-01 ENCOUNTER — Ambulatory Visit: Payer: Managed Care, Other (non HMO) | Admitting: Orthopaedic Surgery

## 2022-08-01 ENCOUNTER — Ambulatory Visit (INDEPENDENT_AMBULATORY_CARE_PROVIDER_SITE_OTHER): Payer: Managed Care, Other (non HMO)

## 2022-08-01 DIAGNOSIS — M542 Cervicalgia: Secondary | ICD-10-CM | POA: Diagnosis not present

## 2022-08-01 DIAGNOSIS — M25551 Pain in right hip: Secondary | ICD-10-CM | POA: Diagnosis not present

## 2022-08-01 MED ORDER — DICLOFENAC SODIUM 75 MG PO TBEC: 75.0000 mg | DELAYED_RELEASE_TABLET | Freq: Two times a day (BID) | ORAL | 2 refills | Status: DC | PRN

## 2022-08-01 NOTE — Progress Notes (Signed)
Office Visit Note   Patient: Breanna Fuller           Date of Birth: [birth year]           MRN: [id number] Visit Date: 08/01/2022              Requested by: Daryll Brod, MD [address] [place],  [place] [address] PCP: Daryll Brod, MD   Assessment & Plan: Visit Diagnoses:  1. Pain of right hip     Plan: Impression is chronic left-sided neck pain and new onset right hip pain concerning for labral tear.  In regards to her neck, she has had most relief from epidural steroid injections and would like a new referral to [facility] imaging for repeat ESI.  In regards to her right hip, we have discussed referral to [facility] imaging for intra-articular cortisone injection.  If her symptoms do not improve we will order an MR arthrogram to assess for labral pathology.  If she does get significant relief from the injection in her left hip worsens, she will let us know we will make referral for left hip injection.  Otherwise, follow-up as needed.  Call with concerns or questions.  Follow-Up Instructions: Return if symptoms worsen or fail to improve.   Orders:  Orders Placed This Encounter  Procedures   XR HIP UNILAT W OR W/O PELVIS 2-3 VIEWS RIGHT   Meds ordered this encounter  Medications   diclofenac (VOLTAREN) 75 MG EC tablet    Sig: Take 1 tablet (75 mg total) by mouth 2 (two) times daily as needed.    Dispense:  60 tablet    Refill:  2      Procedures: No procedures performed   Clinical Data: No additional findings.   Subjective: Chief Complaint  Patient presents with   Neck - Pain   Right Hip - Pain    HPI patient is a pleasant 46 year old [employer] employee comes in today with chronic left-sided neck pain in addition to bilateral hip pain right greater than left pain.  In regards to her neck, she has had pain for about a year.  She denies any injury.  She has been seen by her PCP for this where she has been on Flexeril as well is undergone  ESI and been to physical therapy where she underwent dry needling.  The only relief she got was from the epidural steroid injection which significantly helped until recently.  She continues to have pain to the left lateral neck worse with sleeping and rotating her neck to the left.  She does note numbness into her left hand when sleeping on the left side of her right hand when she is sleeping on the right side.  Otherwise, she denies any paresthesias to either upper extremity.  She did undergo cervical spine MRI back in February of this year which showed mild degenerative changes C5-6 and C6-7 with mild left neuroforaminal stenosis C5-6.  In regards to her hips, she has had pain for the past few months.  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  Her pain is localized to the groin and is worse with squatting as well as hip abduction and abduction.  She has noticed slight improvement with rest over the past few weeks.  Anti-inflammatories do not significantly help.  She denies any paresthesias down either lower extremity.  Review of Systems as detailed in PI.  All others reviewed and are negative.  Objective: Vital Signs: LMP 12/10/2010   Physical Exam well-developed well-nourished female no acute distress.  Alert and oriented x3.  Ortho Exam cervical spine exam shows no spinous tenderness.  Slight tenderness to the left-sided paraspinous musculature.  No pain with cervical spine flexion or extension.  She does have slight pain with left-sided rotation.  Right hip exam reveals a negative logroll.  She does have significant pain with FADIR testing.  Pain with resisted abduction and abduction.  Left hip exam reveals slight discomfort with FADIR.  No pain with logroll.  She is neurovascular intact distally.  Specialty Comments:  No specialty comments available.  Imaging: XR HIP UNILAT W OR W/O PELVIS 2-3 VIEWS RIGHT  Result Date: 08/01/2022 Slight cam impingement right hip.   Otherwise, no acute or structural abnormalities    PMFS History: Patient Active Problem List   Diagnosis Date Noted   Acute sinusitis 01/21/2018   GI symptoms 06/26/2016   Urticaria 12/21/2015   Other allergic rhinitis 08/09/2015   No past medical history on file.  Family History  Problem Relation Age of Onset   Allergic rhinitis Neg Hx    Angioedema Neg Hx    Asthma Neg Hx    Atopy Neg Hx    Eczema Neg Hx    Immunodeficiency Neg Hx    Urticaria Neg Hx    Breast cancer Neg Hx     Past Surgical History:  Procedure Laterality Date   RHINOPLASTY     SINOSCOPY     TUBAL LIGATION     Social History   Occupational History   Not on file  Tobacco Use   Smoking status: Never   Smokeless tobacco: Never  Vaping Use   Vaping Use: Never used  Substance and Sexual Activity   Alcohol use: No    Alcohol/week: 0.0 standard drinks of alcohol   Drug use: No   Sexual activity: Not on file

## 2022-08-03 ENCOUNTER — Other Ambulatory Visit: Payer: Managed Care, Other (non HMO)

## 2022-08-03 ENCOUNTER — Ambulatory Visit
Admission: RE | Admit: 2022-08-03 | Discharge: 2022-08-03 | Disposition: A | Discharge status: Home / Self Care | Payer: Managed Care, Other (non HMO) | Source: Ambulatory Visit | Attending: Orthopaedic Surgery | Admitting: Orthopaedic Surgery

## 2022-08-03 DIAGNOSIS — M25551 Pain in right hip: Secondary | ICD-10-CM

## 2022-08-03 MED ORDER — IOPAMIDOL (ISOVUE-M 200) INJECTION 41%
1.0000 mL | Freq: Once | INTRAMUSCULAR | Status: AC
  Administered 2022-08-03: 1 mL via INTRA_ARTICULAR

## 2022-08-03 MED ORDER — METHYLPREDNISOLONE ACETATE 40 MG/ML INJ SUSP (RADIOLOG
40.0000 mg | Freq: Once | INTRAMUSCULAR | Status: AC
  Administered 2022-08-03: 40 mg via INTRA_ARTICULAR

## 2022-08-14 ENCOUNTER — Ambulatory Visit (INDEPENDENT_AMBULATORY_CARE_PROVIDER_SITE_OTHER): Payer: Managed Care, Other (non HMO) | Admitting: *Deleted

## 2022-08-14 DIAGNOSIS — J309 Allergic rhinitis, unspecified: Secondary | ICD-10-CM

## 2022-08-21 ENCOUNTER — Ambulatory Visit (INDEPENDENT_AMBULATORY_CARE_PROVIDER_SITE_OTHER): Payer: Managed Care, Other (non HMO) | Admitting: *Deleted

## 2022-08-21 DIAGNOSIS — J309 Allergic rhinitis, unspecified: Secondary | ICD-10-CM

## 2022-08-29 ENCOUNTER — Ambulatory Visit
Admission: RE | Admit: 2022-08-29 | Discharge: 2022-08-29 | Disposition: A | Discharge status: Home / Self Care | Payer: Managed Care, Other (non HMO) | Source: Ambulatory Visit | Attending: Orthopaedic Surgery | Admitting: Orthopaedic Surgery

## 2022-08-29 ENCOUNTER — Ambulatory Visit: Payer: Managed Care, Other (non HMO) | Admitting: Orthopaedic Surgery

## 2022-08-29 DIAGNOSIS — M542 Cervicalgia: Secondary | ICD-10-CM

## 2022-08-29 MED ORDER — IOPAMIDOL (ISOVUE-M 300) INJECTION 61%
1.0000 mL | Freq: Once | INTRAMUSCULAR | Status: AC | PRN
  Administered 2022-08-29: 1 mL via EPIDURAL

## 2022-08-29 MED ORDER — TRIAMCINOLONE ACETONIDE 40 MG/ML IJ SUSP (RADIOLOGY)
60.0000 mg | Freq: Once | INTRAMUSCULAR | Status: AC
  Administered 2022-08-29: 60 mg via EPIDURAL

## 2022-09-04 ENCOUNTER — Ambulatory Visit (INDEPENDENT_AMBULATORY_CARE_PROVIDER_SITE_OTHER): Payer: Managed Care, Other (non HMO)

## 2022-09-04 DIAGNOSIS — J309 Allergic rhinitis, unspecified: Secondary | ICD-10-CM

## 2022-09-11 ENCOUNTER — Ambulatory Visit (INDEPENDENT_AMBULATORY_CARE_PROVIDER_SITE_OTHER): Payer: Managed Care, Other (non HMO)

## 2022-09-11 DIAGNOSIS — J309 Allergic rhinitis, unspecified: Secondary | ICD-10-CM

## 2022-09-18 ENCOUNTER — Ambulatory Visit (INDEPENDENT_AMBULATORY_CARE_PROVIDER_SITE_OTHER): Payer: Managed Care, Other (non HMO)

## 2022-09-18 DIAGNOSIS — J309 Allergic rhinitis, unspecified: Secondary | ICD-10-CM | POA: Diagnosis not present

## 2022-09-25 ENCOUNTER — Ambulatory Visit (INDEPENDENT_AMBULATORY_CARE_PROVIDER_SITE_OTHER): Payer: Managed Care, Other (non HMO) | Admitting: *Deleted

## 2022-09-25 DIAGNOSIS — J309 Allergic rhinitis, unspecified: Secondary | ICD-10-CM | POA: Diagnosis not present

## 2022-10-02 ENCOUNTER — Ambulatory Visit (INDEPENDENT_AMBULATORY_CARE_PROVIDER_SITE_OTHER): Payer: Managed Care, Other (non HMO)

## 2022-10-02 DIAGNOSIS — J309 Allergic rhinitis, unspecified: Secondary | ICD-10-CM | POA: Diagnosis not present

## 2022-10-10 ENCOUNTER — Ambulatory Visit (INDEPENDENT_AMBULATORY_CARE_PROVIDER_SITE_OTHER): Payer: Managed Care, Other (non HMO)

## 2022-10-10 DIAGNOSIS — J309 Allergic rhinitis, unspecified: Secondary | ICD-10-CM | POA: Diagnosis not present

## 2022-10-18 ENCOUNTER — Ambulatory Visit (INDEPENDENT_AMBULATORY_CARE_PROVIDER_SITE_OTHER): Payer: Managed Care, Other (non HMO)

## 2022-10-18 DIAGNOSIS — J309 Allergic rhinitis, unspecified: Secondary | ICD-10-CM

## 2022-11-01 DIAGNOSIS — J3089 Other allergic rhinitis: Secondary | ICD-10-CM | POA: Diagnosis not present

## 2022-11-02 NOTE — Progress Notes (Signed)
VIALS EXP 11-03-23 

## 2022-11-08 ENCOUNTER — Ambulatory Visit (INDEPENDENT_AMBULATORY_CARE_PROVIDER_SITE_OTHER): Payer: Managed Care, Other (non HMO)

## 2022-11-08 DIAGNOSIS — J309 Allergic rhinitis, unspecified: Secondary | ICD-10-CM | POA: Diagnosis not present

## 2022-11-12 ENCOUNTER — Encounter: Payer: Self-pay | Admitting: Orthopaedic Surgery

## 2022-11-13 ENCOUNTER — Other Ambulatory Visit: Payer: Self-pay

## 2022-11-13 ENCOUNTER — Other Ambulatory Visit: Payer: Self-pay | Admitting: Orthopaedic Surgery

## 2022-11-13 DIAGNOSIS — M25551 Pain in right hip: Secondary | ICD-10-CM

## 2022-11-13 DIAGNOSIS — M25552 Pain in left hip: Secondary | ICD-10-CM

## 2022-11-13 DIAGNOSIS — M542 Cervicalgia: Secondary | ICD-10-CM

## 2022-11-13 MED ORDER — DICLOFENAC SODIUM 75 MG PO TBEC: 75.0000 mg | DELAYED_RELEASE_TABLET | Freq: Two times a day (BID) | ORAL | 2 refills | Status: AC | PRN

## 2022-11-14 ENCOUNTER — Other Ambulatory Visit: Payer: Self-pay

## 2022-11-14 ENCOUNTER — Ambulatory Visit
Admission: RE | Admit: 2022-11-14 | Discharge: 2022-11-14 | Disposition: A | Discharge status: Home / Self Care | Payer: Managed Care, Other (non HMO) | Source: Ambulatory Visit | Attending: Orthopaedic Surgery | Admitting: Orthopaedic Surgery

## 2022-11-14 DIAGNOSIS — M25551 Pain in right hip: Secondary | ICD-10-CM

## 2022-11-14 DIAGNOSIS — M25552 Pain in left hip: Secondary | ICD-10-CM

## 2022-11-14 MED ORDER — METHYLPREDNISOLONE ACETATE 40 MG/ML INJ SUSP (RADIOLOG
40.0000 mg | Freq: Once | INTRAMUSCULAR | Status: AC
  Administered 2022-11-14: 40 mg via INTRA_ARTICULAR

## 2022-11-14 MED ORDER — IOPAMIDOL (ISOVUE-M 200) INJECTION 41%
1.0000 mL | Freq: Once | INTRAMUSCULAR | Status: AC
  Administered 2022-11-14: 1 mL via INTRA_ARTICULAR

## 2022-11-20 ENCOUNTER — Ambulatory Visit (INDEPENDENT_AMBULATORY_CARE_PROVIDER_SITE_OTHER): Payer: Managed Care, Other (non HMO)

## 2022-11-20 DIAGNOSIS — J309 Allergic rhinitis, unspecified: Secondary | ICD-10-CM

## 2022-11-27 ENCOUNTER — Ambulatory Visit (INDEPENDENT_AMBULATORY_CARE_PROVIDER_SITE_OTHER): Payer: Managed Care, Other (non HMO)

## 2022-11-27 DIAGNOSIS — J309 Allergic rhinitis, unspecified: Secondary | ICD-10-CM | POA: Diagnosis not present

## 2022-12-04 ENCOUNTER — Ambulatory Visit (INDEPENDENT_AMBULATORY_CARE_PROVIDER_SITE_OTHER): Payer: Managed Care, Other (non HMO)

## 2022-12-04 DIAGNOSIS — J309 Allergic rhinitis, unspecified: Secondary | ICD-10-CM | POA: Diagnosis not present

## 2022-12-11 ENCOUNTER — Ambulatory Visit (INDEPENDENT_AMBULATORY_CARE_PROVIDER_SITE_OTHER): Payer: Managed Care, Other (non HMO)

## 2022-12-11 DIAGNOSIS — J309 Allergic rhinitis, unspecified: Secondary | ICD-10-CM | POA: Diagnosis not present

## 2022-12-20 ENCOUNTER — Ambulatory Visit (INDEPENDENT_AMBULATORY_CARE_PROVIDER_SITE_OTHER): Payer: Managed Care, Other (non HMO)

## 2022-12-20 DIAGNOSIS — J309 Allergic rhinitis, unspecified: Secondary | ICD-10-CM

## 2022-12-25 ENCOUNTER — Ambulatory Visit (INDEPENDENT_AMBULATORY_CARE_PROVIDER_SITE_OTHER): Payer: Managed Care, Other (non HMO)

## 2022-12-25 DIAGNOSIS — J309 Allergic rhinitis, unspecified: Secondary | ICD-10-CM | POA: Diagnosis not present

## 2023-01-01 ENCOUNTER — Ambulatory Visit (INDEPENDENT_AMBULATORY_CARE_PROVIDER_SITE_OTHER): Payer: Managed Care, Other (non HMO)

## 2023-01-01 DIAGNOSIS — J309 Allergic rhinitis, unspecified: Secondary | ICD-10-CM

## 2023-01-08 ENCOUNTER — Ambulatory Visit (INDEPENDENT_AMBULATORY_CARE_PROVIDER_SITE_OTHER): Payer: Managed Care, Other (non HMO)

## 2023-01-08 DIAGNOSIS — J309 Allergic rhinitis, unspecified: Secondary | ICD-10-CM | POA: Diagnosis not present

## 2023-01-15 ENCOUNTER — Ambulatory Visit (INDEPENDENT_AMBULATORY_CARE_PROVIDER_SITE_OTHER): Payer: Managed Care, Other (non HMO)

## 2023-01-15 DIAGNOSIS — J309 Allergic rhinitis, unspecified: Secondary | ICD-10-CM | POA: Diagnosis not present

## 2023-02-04 ENCOUNTER — Ambulatory Visit (INDEPENDENT_AMBULATORY_CARE_PROVIDER_SITE_OTHER): Payer: Managed Care, Other (non HMO) | Admitting: *Deleted

## 2023-02-04 DIAGNOSIS — J309 Allergic rhinitis, unspecified: Secondary | ICD-10-CM

## 2023-02-12 ENCOUNTER — Ambulatory Visit (INDEPENDENT_AMBULATORY_CARE_PROVIDER_SITE_OTHER): Payer: Managed Care, Other (non HMO)

## 2023-02-12 DIAGNOSIS — J309 Allergic rhinitis, unspecified: Secondary | ICD-10-CM

## 2023-02-18 ENCOUNTER — Ambulatory Visit (INDEPENDENT_AMBULATORY_CARE_PROVIDER_SITE_OTHER): Payer: Managed Care, Other (non HMO)

## 2023-02-18 DIAGNOSIS — J309 Allergic rhinitis, unspecified: Secondary | ICD-10-CM | POA: Diagnosis not present

## 2023-03-05 ENCOUNTER — Ambulatory Visit (INDEPENDENT_AMBULATORY_CARE_PROVIDER_SITE_OTHER): Payer: Managed Care, Other (non HMO)

## 2023-03-05 DIAGNOSIS — J309 Allergic rhinitis, unspecified: Secondary | ICD-10-CM

## 2023-03-12 ENCOUNTER — Ambulatory Visit (INDEPENDENT_AMBULATORY_CARE_PROVIDER_SITE_OTHER): Payer: Managed Care, Other (non HMO)

## 2023-03-12 DIAGNOSIS — J309 Allergic rhinitis, unspecified: Secondary | ICD-10-CM | POA: Diagnosis not present

## 2023-03-12 NOTE — Progress Notes (Signed)
VIALS EXP 03-11-24 

## 2023-03-13 DIAGNOSIS — J301 Allergic rhinitis due to pollen: Secondary | ICD-10-CM | POA: Diagnosis not present

## 2023-03-19 ENCOUNTER — Ambulatory Visit (INDEPENDENT_AMBULATORY_CARE_PROVIDER_SITE_OTHER): Payer: Managed Care, Other (non HMO)

## 2023-03-19 DIAGNOSIS — J309 Allergic rhinitis, unspecified: Secondary | ICD-10-CM | POA: Diagnosis not present

## 2023-03-26 ENCOUNTER — Ambulatory Visit (INDEPENDENT_AMBULATORY_CARE_PROVIDER_SITE_OTHER): Payer: Managed Care, Other (non HMO) | Admitting: *Deleted

## 2023-03-26 DIAGNOSIS — J309 Allergic rhinitis, unspecified: Secondary | ICD-10-CM

## 2023-04-03 ENCOUNTER — Ambulatory Visit (INDEPENDENT_AMBULATORY_CARE_PROVIDER_SITE_OTHER): Payer: Managed Care, Other (non HMO) | Admitting: *Deleted

## 2023-04-03 DIAGNOSIS — J309 Allergic rhinitis, unspecified: Secondary | ICD-10-CM

## 2023-04-09 ENCOUNTER — Encounter: Payer: Self-pay | Admitting: Family Medicine

## 2023-04-09 ENCOUNTER — Ambulatory Visit: Payer: Managed Care, Other (non HMO) | Admitting: Family Medicine

## 2023-04-09 VITALS — BP 112/60 | HR 60 | Temp 98.3°F | Resp 16 | Wt 135.6 lb

## 2023-04-09 DIAGNOSIS — J309 Allergic rhinitis, unspecified: Secondary | ICD-10-CM | POA: Diagnosis not present

## 2023-04-09 MED ORDER — EPINEPHRINE 0.3 MG/0.3ML IJ SOAJ: 0.3000 mg | INTRAMUSCULAR | 1 refills | Status: AC | PRN

## 2023-04-09 MED ORDER — MONTELUKAST SODIUM 10 MG PO TABS: 10.0000 mg | ORAL_TABLET | Freq: Every day | ORAL | 5 refills | Status: DC

## 2023-04-09 NOTE — Progress Notes (Signed)
[address] [place] [address] Dept: [phone]  FOLLOW UP NOTE  Patient ID: Breanna Fuller, female    DOB: 1975-12-03  Age: 48 y.o. MRN: [id number] Date of Office Visit: 04/09/2023  Assessment  Chief Complaint: Follow-up  HPI Breanna Fuller is a 47 year old female who presents to the clinic for follow-up visit.  She was last seen in this clinic on 01/23/2022 by Dr. Lucie Leather for evaluation of allergic rhinitis and vesicle of her skin near her Achilles tendon.  At today's visit, she reports her allergic rhinitis has been moderately well controlled with occasional symptoms occurring especially during pollen season. She continues daily cetirizine and is not currently using nasal saline rinses or nasal steroid spray. She continues on allergen immunotherapy and had had difficulty increasing her dosing due to frequent local reactions. She is currently taking montelukast 10 mg and loratadine 10 mg once on the day she receives her allergen immunotherapy. She is currently getting injections once every 3 weeks after the build up. She is getting epiwash with each injection of her three injections. She is considering retesting her environmental allergies in an effort to guide allergen immunotherapy therapy continuation or cessation.  Her current medications are listed in the chart.    Drug Allergies:  Allergies  Allergen Reactions   Vancomycin Hives   Latex Itching   Monistat [Miconazole] Swelling   Terazol [Terconazole] Swelling   Wellbutrin [Bupropion] Rash    Physical Exam: BP 112/60   Pulse 60   Temp 98.3 F (36.8 C) (Temporal)   Resp 16   Wt 135 lb 9.6 oz (61.5 kg)   LMP 12/10/2010   SpO2 98%   BMI 21.24 kg/m    Physical Exam Vitals reviewed.  Constitutional:      Appearance: Normal appearance.  HENT:     Head: Normocephalic and atraumatic.     Right Ear: Tympanic membrane normal.     Left Ear: Tympanic membrane normal.     Nose:     Comments: Bilateral  nares slightly erythematous with clear nasal drainage noted. Pharynx normal. Ears normal. Eyes normal.    Mouth/Throat:     Pharynx: Oropharynx is clear.  Eyes:     Conjunctiva/sclera: Conjunctivae normal.  Cardiovascular:     Rate and Rhythm: Normal rate and regular rhythm.     Heart sounds: Normal heart sounds. No murmur heard. Pulmonary:     Effort: Pulmonary effort is normal.     Breath sounds: Normal breath sounds.     Comments: Lungs clear to auscultation Musculoskeletal:        General: Normal range of motion.     Cervical back: Normal range of motion and neck supple.  Skin:    General: Skin is warm and dry.  Neurological:     Mental Status: She is alert and oriented to person, place, and time.  Psychiatric:        Mood and Affect: Mood normal.        Behavior: Behavior normal.        Thought Content: Thought content normal.        Judgment: Judgment normal.     Assessment and Plan: 1. Allergic rhinitis, unspecified seasonality, unspecified trigger     Meds ordered this encounter  Medications   EPINEPHrine (EPIPEN 2-PAK) 0.3 mg/0.3 mL IJ SOAJ injection    Sig: Inject 0.3 mg into the muscle as needed.    Dispense:  1 each    Refill:  1  montelukast (SINGULAIR) 10 MG tablet    Sig: Take 1 tablet (10 mg total) by mouth daily.    Dispense:  30 tablet    Refill:  5    Patient Instructions   1.  At lunchtime prior to immunotherapy use the following:  A. Loratadine 10 mg B. Famotidine 20 mg  2.  Continue to use cetirizine 10 mg 1 time per day in evening  3.  Begin montelukast 10 mg once a day  4.  Continue immunotherapy and attempt to escalate dosage. Consider retesting if interested  4. Return to clinic in 1 year or earlier if problem  Return in about 1 year (around 04/08/2024), or if symptoms worsen or fail to improve.    Thank you for the opportunity to care for this patient.  Please do not hesitate to contact me with questions.  Thermon Leyland,  FNP [facility]

## 2023-04-09 NOTE — Patient Instructions (Addendum)
  1.  At lunchtime prior to immunotherapy use the following:  A. Loratadine 10 mg B. Famotidine 20 mg  2.  Continue to use cetirizine 10 mg 1 time per day in evening  3.  Begin montelukast 10 mg once a day  4.  Continue immunotherapy and attempt to escalate dosage. Consider retesting if interested  4. Return to clinic in 1 year or earlier if problem

## 2023-04-15 ENCOUNTER — Ambulatory Visit (INDEPENDENT_AMBULATORY_CARE_PROVIDER_SITE_OTHER): Payer: Managed Care, Other (non HMO) | Admitting: *Deleted

## 2023-04-15 DIAGNOSIS — J309 Allergic rhinitis, unspecified: Secondary | ICD-10-CM

## 2023-04-23 ENCOUNTER — Ambulatory Visit (INDEPENDENT_AMBULATORY_CARE_PROVIDER_SITE_OTHER): Payer: Managed Care, Other (non HMO) | Admitting: *Deleted

## 2023-04-23 DIAGNOSIS — J309 Allergic rhinitis, unspecified: Secondary | ICD-10-CM

## 2023-04-30 ENCOUNTER — Ambulatory Visit (INDEPENDENT_AMBULATORY_CARE_PROVIDER_SITE_OTHER): Payer: Managed Care, Other (non HMO)

## 2023-04-30 DIAGNOSIS — J309 Allergic rhinitis, unspecified: Secondary | ICD-10-CM

## 2023-05-14 ENCOUNTER — Ambulatory Visit (INDEPENDENT_AMBULATORY_CARE_PROVIDER_SITE_OTHER): Payer: Managed Care, Other (non HMO)

## 2023-05-14 DIAGNOSIS — J309 Allergic rhinitis, unspecified: Secondary | ICD-10-CM | POA: Diagnosis not present

## 2023-05-21 ENCOUNTER — Ambulatory Visit (INDEPENDENT_AMBULATORY_CARE_PROVIDER_SITE_OTHER): Payer: Managed Care, Other (non HMO) | Admitting: *Deleted

## 2023-05-21 DIAGNOSIS — J309 Allergic rhinitis, unspecified: Secondary | ICD-10-CM

## 2023-06-04 ENCOUNTER — Ambulatory Visit (INDEPENDENT_AMBULATORY_CARE_PROVIDER_SITE_OTHER): Payer: Managed Care, Other (non HMO)

## 2023-06-04 DIAGNOSIS — J309 Allergic rhinitis, unspecified: Secondary | ICD-10-CM

## 2023-06-13 DIAGNOSIS — J3089 Other allergic rhinitis: Secondary | ICD-10-CM

## 2023-06-13 NOTE — Progress Notes (Signed)
VIALS EXP 06-12-24

## 2023-06-18 ENCOUNTER — Telehealth: Payer: Self-pay | Admitting: *Deleted

## 2023-06-18 ENCOUNTER — Ambulatory Visit (INDEPENDENT_AMBULATORY_CARE_PROVIDER_SITE_OTHER): Payer: Managed Care, Other (non HMO) | Admitting: *Deleted

## 2023-06-18 DIAGNOSIS — J309 Allergic rhinitis, unspecified: Secondary | ICD-10-CM | POA: Diagnosis not present

## 2023-06-18 NOTE — Telephone Encounter (Signed)
Patient came in for her Allergy Injections today and stated that when she received her injections 2 weeks ago in her left arm was a lot of pain and she believes that the needle went into her muscle. She states that she had some numbness then and there is a part of her upper arm that is still  numb after 2 weeks. Do you have any recommendation?

## 2023-06-19 NOTE — Telephone Encounter (Signed)
I called and spoke with the patient and scheduled her to see Dr. Dellis Anes tomorrow afternoon. She was wondering if she should have to pay a copay since this is something that occurred on our behalf. I did advise that I would reach out and speak with my boss and we would have an answer for her at her appointment. Patient verbalized understanding.

## 2023-06-20 ENCOUNTER — Ambulatory Visit (INDEPENDENT_AMBULATORY_CARE_PROVIDER_SITE_OTHER): Payer: Managed Care, Other (non HMO) | Admitting: Allergy & Immunology

## 2023-06-20 ENCOUNTER — Encounter: Payer: Self-pay | Admitting: Allergy & Immunology

## 2023-06-20 ENCOUNTER — Other Ambulatory Visit: Payer: Self-pay

## 2023-06-20 VITALS — BP 110/70 | HR 61 | Temp 98.0°F | Resp 16 | Ht 67.0 in | Wt 134.0 lb

## 2023-06-20 DIAGNOSIS — L239 Allergic contact dermatitis, unspecified cause: Secondary | ICD-10-CM

## 2023-06-20 DIAGNOSIS — J3089 Other allergic rhinitis: Secondary | ICD-10-CM

## 2023-06-20 DIAGNOSIS — J302 Other seasonal allergic rhinitis: Secondary | ICD-10-CM

## 2023-06-20 NOTE — Progress Notes (Signed)
FOLLOW UP  Date of Service/Encounter:  06/20/23   Assessment:   Perennial and seasonal allergic rhinitis - on allergen immunotherapy  Left arm numbness from an injection earlier this month   Starting epinephrine rinses now and will try to advance on Schedule A in January 2023  Plan/Recommendations:    1. Perennial and seasonal allergy rhinitis - Let's do repeat environmental allergy testing via the blood to see where to go from there.  - We will call you in 1-2 weeks with the results of the testing.  - Continue with cetirizine 10mg  daily and montelukast daily.  - On with Pepcid and Claritin on the days of your shots.  - We will monitor the numbness.   2. Follow up as scheduled. I did a no charge visit code today given the circumstances.     Subjective:   Breanna Fuller is a 47 y.o. female presenting today for follow up of  Chief Complaint  Patient presents with   Arm Pain    Patient stated she received allergy injections on Tuesday, 06/04/23 - left arm -at injection site - has been numb for the past two weeks.     Breanna Fuller has a history of the following: Patient Active Problem List   Diagnosis Date Noted   Acute sinusitis 01/21/2018   GI symptoms 06/26/2016   Urticaria 12/21/2015   Other allergic rhinitis 08/09/2015    History obtained from: chart review and patient.  Breanna Fuller is a 47 y.o. female presenting for a sick visit.  She was seen in June or 2024.  At that time, she was continued on loratadine and famotidine daily as well as cetirizine 10 mg once an evening.  She was started on montelukast 10 mg daily.  In the interim, she has mostly done well from an allergic rhinitis perspective.   She had shots on August 6th, she had her shots from [place]. Patient reports that she was "not delicate". She was very aggressive with the alcohol and she was slamming things around. It hurt a lot. 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 She had two injections in her  right arm (she gets three shots total). She actually called Ferdie Ping the day after about the way this was treated. She had a sore tricep and then she noticed that her left arm was numb. She felt that it was going to go away, but it has not really improved at all. It is numb. The size again has not changed at all.   She is wondering whether she needs to be retested. She does feel that they are working very well. She had a really good year. She is on cetirizine at night and montelukast at lunch.  Breanna Fuller is on allergen immunotherapy. She receives two injections. Immunotherapy script #1 contains trees and dust mites. She currently receives 0.35mL of the RED vial (1/100). Immunotherapy script #2 contains molds, cat and cockroach. She currently receives 0.80mL of the RED vial (1/100). Immunotherapy script #3 contains grasses and weeds. She currently receives 0.2 mL of the RED vial (1/100). She started shots in 2016 or so and reached maintenance in sometime in 2020 or so (reached Red Vial at various time courses).    Otherwise, there have been no changes to her past medical history, surgical history, family history, or social history.    Review of systems otherwise negative other than that mentioned in the HPI.    Objective:   Blood pressure 110/70, pulse 61,  temperature 98 F (36.7 C), temperature source Temporal, resp. rate 16, height 5\' 7"  (1.702 m), weight 134 lb (60.8 kg), last menstrual period 12/10/2010, SpO2 97%. Body mass index is 20.99 kg/m.    Physical Exam Vitals reviewed.  Constitutional:      Appearance: She is well-developed.  HENT:     Head: Normocephalic and atraumatic.     Right Ear: Tympanic membrane, ear canal and external ear normal.     Left Ear: Tympanic membrane, ear canal and external ear normal.     Nose: No nasal deformity, septal deviation, mucosal edema or rhinorrhea.     Right Turbinates: Enlarged. Not swollen.     Left Turbinates: Enlarged. Not swollen.      Right Sinus: No maxillary sinus tenderness or frontal sinus tenderness.     Left Sinus: No maxillary sinus tenderness or frontal sinus tenderness.     Mouth/Throat:     Mouth: Mucous membranes are not pale and not dry.     Pharynx: Uvula midline.  Eyes:     General: Lids are normal. No allergic shiner.       Right eye: No discharge.        Left eye: No discharge.     Conjunctiva/sclera: Conjunctivae normal.     Right eye: Right conjunctiva is not injected. No chemosis.    Left eye: Left conjunctiva is not injected. No chemosis.    Pupils: Pupils are equal, round, and reactive to light.  Cardiovascular:     Rate and Rhythm: Normal rate and regular rhythm.     Heart sounds: Normal heart sounds.  Pulmonary:     Effort: Pulmonary effort is normal. No tachypnea, accessory muscle usage or respiratory distress.     Breath sounds: Normal breath sounds. No wheezing, rhonchi or rales.  Chest:     Chest wall: No tenderness.  Lymphadenopathy:     Cervical: No cervical adenopathy.  Skin:    Coloration: Skin is not pale.     Findings: No abrasion, erythema, petechiae or rash. Rash is not papular, urticarial or vesicular.  Neurological:     Mental Status: She is alert.  Psychiatric:        Behavior: Behavior is cooperative.      Diagnostic studies: repeat environmental allergy testing send via the blood       Malachi Bonds, MD  [facility]

## 2023-06-20 NOTE — Patient Instructions (Addendum)
1. Perennial and seasonal allergy rhinitis - Let's do repeat environmental allergy testing via the blood to see where to go from there.  - We will call you in 1-2 weeks with the results of the testing.  - Continue with cetirizine 10mg  daily and montelukast daily.  - On with Pepcid and Claritin on the days of your shots.  - We will monitor the numbness.   2. Follow up as scheduled. I did a no charge visit code today given the circumstances.    Please inform us of any Emergency Department visits, hospitalizations, or changes in symptoms. Call us before going to the ED for breathing or allergy symptoms since we might be able to fit you in for a sick visit. Feel free to contact us anytime with any questions, problems, or concerns.  It was a pleasure to see you again today!  Websites that have reliable patient information: 1. American Academy of Asthma, Allergy, and Immunology: www.aaaai.org 2. Food Allergy Research and Education (FARE): foodallergy.org 3. Mothers of Asthmatics: http://www.asthmacommunitynetwork.org 4. American College of Allergy, Asthma, and Immunology: www.acaai.org   COVID-19 Vaccine Information can be found at: [email or web address] For questions related to vaccine distribution or appointments, please email [email or web address] or call [phone].   We realize that you might be concerned about having an allergic reaction to the COVID19 vaccines. To help with that concern, WE ARE OFFERING THE COVID19 VACCINES IN OUR OFFICE! Ask the front desk for dates!     "Like" [place] on Facebook and Instagram for our latest updates!      A healthy democracy works best when [employer] participate! Make sure you are registered to vote! If you have moved or changed any of your contact information, you will need to get this updated before voting!  In some cases, you MAY be able to register to vote online:  [email or web address]

## 2023-06-21 ENCOUNTER — Encounter: Payer: Self-pay | Admitting: Allergy & Immunology

## 2023-06-25 ENCOUNTER — Ambulatory Visit (INDEPENDENT_AMBULATORY_CARE_PROVIDER_SITE_OTHER): Payer: Managed Care, Other (non HMO) | Admitting: *Deleted

## 2023-06-25 ENCOUNTER — Encounter: Payer: Self-pay | Admitting: Allergy & Immunology

## 2023-06-25 DIAGNOSIS — J309 Allergic rhinitis, unspecified: Secondary | ICD-10-CM

## 2023-06-25 LAB — ALLERGENS W/COMP RFLX AREA 2
Alternaria Alternata IgE: <0.1 kU/L
Aspergillus Fumigatus IgE: <0.1 kU/L
Bermuda Grass IgE: 0.44 kU/L — AB
Cedar, Mountain IgE: 0.31 kU/L — AB
Cladosporium Herbarum IgE: <0.1 kU/L
Cockroach, German IgE: 0.25 kU/L — AB
Common Silver Birch IgE: 0.13 kU/L — AB
Cottonwood IgE: 0.17 kU/L — AB
D Farinae IgE: 0.12 kU/L — AB
D Pteronyssinus IgE: <0.1 kU/L
E001-IgE Cat Dander: <0.1 kU/L
E005-IgE Dog Dander: <0.1 kU/L
Elm, American IgE: 0.23 kU/L — AB
IgE (Immunoglobulin E), Serum: 109 IU/mL (ref 6–495)
Johnson Grass IgE: 0.6 kU/L — AB
Maple/Box Elder IgE: 0.15 kU/L — AB
Mouse Urine IgE: <0.1 kU/L
Oak, White IgE: 0.59 kU/L — AB
Pecan, Hickory IgE: 0.29 kU/L — AB
Penicillium Chrysogen IgE: <0.1 kU/L
Pigweed, Rough IgE: <0.1 kU/L
Ragweed, Short IgE: 0.89 kU/L — AB
Sheep Sorrel IgE Qn: 0.29 kU/L — AB
Timothy Grass IgE: 3.06 kU/L — AB
White Mulberry IgE: <0.1 kU/L

## 2023-06-25 LAB — ALLERGEN PROFILE, MOLD
Aureobasidi Pullulans IgE: <0.1 kU/L
Candida Albicans IgE: <0.1 kU/L
M009-IgE Fusarium proliferatum: 0.11 kU/L — AB
M014-IgE Epicoccum purpur: <0.1 kU/L
Mucor Racemosus IgE: <0.1 kU/L
Phoma Betae IgE: <0.1 kU/L
Setomelanomma Rostrat: <0.1 kU/L
Stemphylium Herbarum IgE: <0.1 kU/L

## 2023-06-25 NOTE — Telephone Encounter (Signed)
Per Dr. Dellis Anes we got blood work to check out her allergies. She wants to continue with the allergy shot despite the numbness.

## 2023-06-25 NOTE — Telephone Encounter (Signed)
I did a no charge visit.   Malachi Bonds, MD [facility]

## 2023-07-08 ENCOUNTER — Other Ambulatory Visit: Payer: Self-pay | Admitting: Family Medicine

## 2023-07-08 DIAGNOSIS — Z1231 Encounter for screening mammogram for malignant neoplasm of breast: Secondary | ICD-10-CM

## 2023-07-09 ENCOUNTER — Ambulatory Visit (INDEPENDENT_AMBULATORY_CARE_PROVIDER_SITE_OTHER): Payer: Self-pay

## 2023-07-09 DIAGNOSIS — J309 Allergic rhinitis, unspecified: Secondary | ICD-10-CM

## 2023-07-23 ENCOUNTER — Ambulatory Visit (INDEPENDENT_AMBULATORY_CARE_PROVIDER_SITE_OTHER): Payer: Self-pay

## 2023-07-23 DIAGNOSIS — J309 Allergic rhinitis, unspecified: Secondary | ICD-10-CM

## 2023-07-25 ENCOUNTER — Ambulatory Visit
Admission: RE | Admit: 2023-07-25 | Discharge: 2023-07-25 | Disposition: A | Discharge status: Home / Self Care | Payer: Managed Care, Other (non HMO) | Source: Ambulatory Visit | Attending: Family Medicine | Admitting: Family Medicine

## 2023-07-25 DIAGNOSIS — Z1231 Encounter for screening mammogram for malignant neoplasm of breast: Secondary | ICD-10-CM

## 2023-07-30 ENCOUNTER — Ambulatory Visit (INDEPENDENT_AMBULATORY_CARE_PROVIDER_SITE_OTHER): Payer: Managed Care, Other (non HMO) | Admitting: *Deleted

## 2023-07-30 DIAGNOSIS — J309 Allergic rhinitis, unspecified: Secondary | ICD-10-CM

## 2023-07-31 ENCOUNTER — Other Ambulatory Visit (INDEPENDENT_AMBULATORY_CARE_PROVIDER_SITE_OTHER): Payer: Self-pay

## 2023-07-31 ENCOUNTER — Ambulatory Visit: Payer: Managed Care, Other (non HMO) | Admitting: Orthopaedic Surgery

## 2023-07-31 ENCOUNTER — Encounter: Payer: Self-pay | Admitting: Orthopaedic Surgery

## 2023-07-31 DIAGNOSIS — M25551 Pain in right hip: Secondary | ICD-10-CM

## 2023-07-31 DIAGNOSIS — M25511 Pain in right shoulder: Secondary | ICD-10-CM

## 2023-07-31 DIAGNOSIS — G8929 Other chronic pain: Secondary | ICD-10-CM

## 2023-07-31 MED ORDER — BUPIVACAINE HCL 0.5 % IJ SOLN
3.0000 mL | INTRAMUSCULAR | Status: AC | PRN
  Administered 2023-07-31: 3 mL via INTRA_ARTICULAR

## 2023-07-31 MED ORDER — METHYLPREDNISOLONE ACETATE 40 MG/ML IJ SUSP
40.0000 mg | INTRAMUSCULAR | Status: AC | PRN
  Administered 2023-07-31: 40 mg via INTRA_ARTICULAR

## 2023-07-31 MED ORDER — LIDOCAINE HCL 1 % IJ SOLN
3.0000 mL | INTRAMUSCULAR | Status: AC | PRN
  Administered 2023-07-31: 3 mL

## 2023-07-31 NOTE — Progress Notes (Signed)
Office Visit Note   Patient: Breanna Fuller           Date of Birth: 1975-11-19           MRN: [id number] Visit Date: 07/31/2023              Requested by: Ofilia Neas, MD [address] [place],  [place] [address] PCP: Ofilia Neas, MD   Assessment & Plan: Visit Diagnoses:  1. Chronic right shoulder pain   2. Pain of right hip     Plan: [name] is a 47 year old female with right shoulder pain and chronic right hip pain.  For the shoulder impression is rotator cuff tendinitis.  She elected to undergo a subacromial injection today.  Activity modification as needed.  For the hip she had good relief from a prior steroid injection and she would like to repeat this.  I will send the order to [facility] imaging per her request.  Follow-up as needed.  Follow-Up Instructions: No follow-ups on file.   Orders:  Orders Placed This Encounter  Procedures   Large Joint Inj   XR Shoulder Right   No orders of the defined types were placed in this encounter.     Procedures: Large Joint Inj: R subacromial bursa on 07/31/2023 10:29 AM Indications: pain Details: 22 G needle  Arthrogram: No  Medications: 3 mL lidocaine 1 %; 3 mL bupivacaine 0.5 %; 40 mg methylPREDNISolone acetate 40 MG/ML Outcome: tolerated well, no immediate complications Consent was given by the patient. Patient was prepped and draped in the usual sterile fashion.       Clinical Data: No additional findings.   Subjective: Chief Complaint  Patient presents with   Right Shoulder - Pain    HPI Breanna Fuller comes in today for evaluation of right shoulder pain for about 5 months and ongoing right hip pain.  For the shoulder she has had physical therapy and dry needling.  She is taking a break from bodybuilding.  She feels lateral deltoid pain.  She does some trigger point injections which recreates the pain.  She is having trouble holding the barn for squatting.  Denies any neck symptoms.  In regards  to the hip she had a previous injection at [facility] imaging that was very effective.  Her pain has returned. Review of Systems  Constitutional: Negative.   HENT: Negative.    Eyes: Negative.   Respiratory: Negative.    Cardiovascular: Negative.   Endocrine: Negative.   Musculoskeletal: Negative.   Neurological: Negative.   Hematological: Negative.   Psychiatric/Behavioral: Negative.    All other systems reviewed and are negative.    Objective: Vital Signs: LMP 12/10/2010   Physical Exam Vitals and nursing note reviewed.  Constitutional:      Appearance: She is well-developed.  HENT:     Head: Normocephalic and atraumatic.  Pulmonary:     Effort: Pulmonary effort is normal.  Abdominal:     Palpations: Abdomen is soft.  Musculoskeletal:     Cervical back: Neck supple.  Skin:    General: Skin is warm.     Capillary Refill: Capillary refill takes less than 2 seconds.  Neurological:     Mental Status: She is alert and oriented to person, place, and time.  Psychiatric:        Behavior: Behavior normal.        Thought Content: Thought content normal.        Judgment: Judgment normal.  Ortho Exam Exam of the right shoulder shows full range of motion without pain.  She has pain with empty can test without weakness.  No impingement signs.  AC joint is nontender.  Exam of the right hip is unchanged. Specialty Comments:  No specialty comments available.  Imaging: XR Shoulder Right  Result Date: 07/31/2023 3 view xrays show no acute or structural abnormalities    PMFS History: Patient Active Problem List   Diagnosis Date Noted   Acute sinusitis 01/21/2018   GI symptoms 06/26/2016   Urticaria 12/21/2015   Other allergic rhinitis 08/09/2015   History reviewed. No pertinent past medical history.  Family History  Problem Relation Age of Onset   Allergic rhinitis Neg Hx    Angioedema Neg Hx    Asthma Neg Hx    Atopy Neg Hx    Eczema Neg Hx     Immunodeficiency Neg Hx    Urticaria Neg Hx    Breast cancer Neg Hx     Past Surgical History:  Procedure Laterality Date   RHINOPLASTY     SINOSCOPY     TUBAL LIGATION     Social History   Occupational History   Not on file  Tobacco Use   Smoking status: Never    Passive exposure: Never   Smokeless tobacco: Never  Vaping Use   Vaping status: Never Used  Substance and Sexual Activity   Alcohol use: No    Alcohol/week: 0.0 standard drinks of alcohol   Drug use: No   Sexual activity: Not on file

## 2023-07-31 NOTE — Addendum Note (Signed)
Addended by: Wendi Maya on: 07/31/2023 11:11 AM   Modules accepted: Orders

## 2023-08-01 LAB — COLOGUARD: COLOGUARD: NEGATIVE

## 2023-08-06 ENCOUNTER — Ambulatory Visit (INDEPENDENT_AMBULATORY_CARE_PROVIDER_SITE_OTHER): Payer: Managed Care, Other (non HMO) | Admitting: *Deleted

## 2023-08-06 DIAGNOSIS — J309 Allergic rhinitis, unspecified: Secondary | ICD-10-CM

## 2023-08-12 ENCOUNTER — Encounter: Payer: Self-pay | Admitting: Orthopaedic Surgery

## 2023-08-12 ENCOUNTER — Other Ambulatory Visit: Payer: Self-pay

## 2023-08-12 DIAGNOSIS — M542 Cervicalgia: Secondary | ICD-10-CM

## 2023-08-12 NOTE — Telephone Encounter (Signed)
Please order cervical injection at [facility] imaging.  Thanks.

## 2023-08-13 ENCOUNTER — Encounter: Payer: Self-pay | Admitting: Orthopaedic Surgery

## 2023-08-13 ENCOUNTER — Ambulatory Visit
Admission: RE | Admit: 2023-08-13 | Discharge: 2023-08-13 | Disposition: A | Discharge status: Home / Self Care | Payer: Managed Care, Other (non HMO) | Source: Ambulatory Visit | Attending: Orthopaedic Surgery | Admitting: Orthopaedic Surgery

## 2023-08-13 DIAGNOSIS — M542 Cervicalgia: Secondary | ICD-10-CM

## 2023-08-13 MED ORDER — TRIAMCINOLONE ACETONIDE 40 MG/ML IJ SUSP (RADIOLOGY)
60.0000 mg | Freq: Once | INTRAMUSCULAR | Status: AC
  Administered 2023-08-13: 60 mg via EPIDURAL

## 2023-08-13 MED ORDER — IOPAMIDOL (ISOVUE-M 300) INJECTION 61%
1.0000 mL | Freq: Once | INTRAMUSCULAR | Status: AC | PRN
  Administered 2023-08-13: 1 mL via EPIDURAL

## 2023-08-14 ENCOUNTER — Inpatient Hospital Stay: Admission: RE | Admit: 2023-08-14 | Payer: Managed Care, Other (non HMO) | Source: Ambulatory Visit

## 2023-08-16 ENCOUNTER — Ambulatory Visit (INDEPENDENT_AMBULATORY_CARE_PROVIDER_SITE_OTHER): Payer: Managed Care, Other (non HMO)

## 2023-08-16 DIAGNOSIS — J309 Allergic rhinitis, unspecified: Secondary | ICD-10-CM

## 2023-08-20 ENCOUNTER — Ambulatory Visit (INDEPENDENT_AMBULATORY_CARE_PROVIDER_SITE_OTHER): Payer: Self-pay | Admitting: *Deleted

## 2023-08-20 DIAGNOSIS — J309 Allergic rhinitis, unspecified: Secondary | ICD-10-CM | POA: Diagnosis not present

## 2023-08-29 ENCOUNTER — Ambulatory Visit (INDEPENDENT_AMBULATORY_CARE_PROVIDER_SITE_OTHER): Payer: Managed Care, Other (non HMO)

## 2023-08-29 DIAGNOSIS — J309 Allergic rhinitis, unspecified: Secondary | ICD-10-CM

## 2023-09-17 ENCOUNTER — Ambulatory Visit (INDEPENDENT_AMBULATORY_CARE_PROVIDER_SITE_OTHER): Payer: Managed Care, Other (non HMO)

## 2023-09-17 DIAGNOSIS — J309 Allergic rhinitis, unspecified: Secondary | ICD-10-CM | POA: Diagnosis not present

## 2023-10-08 ENCOUNTER — Ambulatory Visit (INDEPENDENT_AMBULATORY_CARE_PROVIDER_SITE_OTHER): Payer: Self-pay

## 2023-10-08 DIAGNOSIS — J309 Allergic rhinitis, unspecified: Secondary | ICD-10-CM | POA: Diagnosis not present

## 2023-11-07 ENCOUNTER — Ambulatory Visit (INDEPENDENT_AMBULATORY_CARE_PROVIDER_SITE_OTHER): Payer: Managed Care, Other (non HMO)

## 2023-11-07 DIAGNOSIS — J309 Allergic rhinitis, unspecified: Secondary | ICD-10-CM

## 2023-11-14 ENCOUNTER — Encounter: Payer: Self-pay | Admitting: Orthopaedic Surgery

## 2023-11-14 ENCOUNTER — Other Ambulatory Visit: Payer: Self-pay

## 2023-11-14 ENCOUNTER — Ambulatory Visit
Admission: RE | Admit: 2023-11-14 | Discharge: 2023-11-14 | Disposition: A | Discharge status: Home / Self Care | Payer: Managed Care, Other (non HMO) | Source: Ambulatory Visit | Attending: Orthopaedic Surgery | Admitting: Orthopaedic Surgery

## 2023-11-14 DIAGNOSIS — M542 Cervicalgia: Secondary | ICD-10-CM

## 2023-11-14 MED ORDER — TRIAMCINOLONE ACETONIDE 40 MG/ML IJ SUSP (RADIOLOGY)
60.0000 mg | Freq: Once | INTRAMUSCULAR | Status: AC
  Administered 2023-11-14: 60 mg via EPIDURAL

## 2023-11-14 MED ORDER — IOPAMIDOL (ISOVUE-M 300) INJECTION 61%
1.0000 mL | Freq: Once | INTRAMUSCULAR | Status: AC | PRN
  Administered 2023-11-14: 1 mL via EPIDURAL

## 2023-11-14 NOTE — Telephone Encounter (Signed)
Please order ESI at [facility] imaging.  Thanks.

## 2023-12-05 ENCOUNTER — Ambulatory Visit (INDEPENDENT_AMBULATORY_CARE_PROVIDER_SITE_OTHER): Payer: Managed Care, Other (non HMO) | Admitting: *Deleted

## 2023-12-05 DIAGNOSIS — J309 Allergic rhinitis, unspecified: Secondary | ICD-10-CM

## 2023-12-19 ENCOUNTER — Ambulatory Visit: Payer: Managed Care, Other (non HMO) | Admitting: Allergy & Immunology

## 2023-12-24 ENCOUNTER — Ambulatory Visit (INDEPENDENT_AMBULATORY_CARE_PROVIDER_SITE_OTHER): Payer: Self-pay | Admitting: *Deleted

## 2023-12-24 DIAGNOSIS — J309 Allergic rhinitis, unspecified: Secondary | ICD-10-CM | POA: Diagnosis not present

## 2023-12-24 NOTE — Progress Notes (Signed)
 VIAL ONE MADE 12-24-23. EXP 12-23-24

## 2023-12-25 DIAGNOSIS — J3089 Other allergic rhinitis: Secondary | ICD-10-CM | POA: Diagnosis not present

## 2023-12-25 NOTE — Progress Notes (Signed)
 VIAL 2 MADE 12-25-23. EXP 12-24-24

## 2024-01-14 ENCOUNTER — Ambulatory Visit (INDEPENDENT_AMBULATORY_CARE_PROVIDER_SITE_OTHER): Payer: Self-pay | Admitting: *Deleted

## 2024-01-14 DIAGNOSIS — J309 Allergic rhinitis, unspecified: Secondary | ICD-10-CM | POA: Diagnosis not present

## 2024-02-04 ENCOUNTER — Encounter: Payer: Self-pay | Admitting: Orthopaedic Surgery

## 2024-02-04 ENCOUNTER — Ambulatory Visit: Admitting: Orthopaedic Surgery

## 2024-02-04 DIAGNOSIS — G8929 Other chronic pain: Secondary | ICD-10-CM | POA: Diagnosis not present

## 2024-02-04 DIAGNOSIS — M25521 Pain in right elbow: Secondary | ICD-10-CM | POA: Diagnosis not present

## 2024-02-04 DIAGNOSIS — M25511 Pain in right shoulder: Secondary | ICD-10-CM

## 2024-02-04 MED ORDER — METHYLPREDNISOLONE ACETATE 40 MG/ML IJ SUSP
40.0000 mg | INTRAMUSCULAR | Status: AC | PRN
  Administered 2024-02-04: 40 mg via INTRA_ARTICULAR

## 2024-02-04 MED ORDER — LIDOCAINE HCL 1 % IJ SOLN
1.0000 mL | INTRAMUSCULAR | Status: AC | PRN
  Administered 2024-02-04: 1 mL

## 2024-02-04 MED ORDER — BUPIVACAINE HCL 0.5 % IJ SOLN
1.0000 mL | INTRAMUSCULAR | Status: AC | PRN
  Administered 2024-02-04: 1 mL via INTRA_ARTICULAR

## 2024-02-04 NOTE — Progress Notes (Signed)
 Office Visit Note   Patient: Breanna Fuller           Date of Birth: 1976/01/22           MRN: [id number] Visit Date: 02/04/2024              Requested by: Ofilia Neas, MD [address] [place],  [place] [address] PCP: Ofilia Neas, MD   Assessment & Plan: Visit Diagnoses:  1. Pain in right elbow   2. Chronic right shoulder pain     Plan: In regards to the shoulder this is doing okay.  She will modify activity as needed.  For the elbow this is keeping her up at night and preventing her from performing ADLs.  We decided to do a cortisone injection which she tolerated well.  I am going give her home exercises.  She understands her limitations for the next 8 weeks due to this injection.  Recommend counterforce brace during activity.  Follow-Up Instructions: No follow-ups on file.   Orders:  No orders of the defined types were placed in this encounter.  No orders of the defined types were placed in this encounter.     Procedures: Medium Joint Inj: R lateral epicondyle on 02/04/2024 9:10 AM Indications: pain Details: 25 G needle Medications: 1 mL lidocaine 1 %; 1 mL bupivacaine 0.5 %; 40 mg methylPREDNISolone acetate 40 MG/ML Outcome: tolerated well, no immediate complications Patient was prepped and draped in the usual sterile fashion.       Clinical Data: No additional findings.   Subjective: Chief Complaint  Patient presents with   Right Shoulder - Pain   Right Elbow - Pain    HPI Patient returns today for follow-up of right shoulder and evaluation of new problem of right elbow pain for 2 months.  In regards to shoulder we did a subacromial injection about 6 months ago which provided relief until about a week ago.  Currently the symptoms are manageable.  Her right elbow has been bothering her a lot more.  This is waking her up at night.  She is a Pharmacist, community.  Denies any numbness and tingling. Review of Systems  Constitutional: Negative.   HENT:  Negative.    Eyes: Negative.   Respiratory: Negative.    Cardiovascular: Negative.   Endocrine: Negative.   Musculoskeletal: Negative.   Neurological: Negative.   Hematological: Negative.   Psychiatric/Behavioral: Negative.    All other systems reviewed and are negative.    Objective: Vital Signs: LMP 12/10/2010   Physical Exam Vitals and nursing note reviewed.  Constitutional:      Appearance: She is well-developed.  HENT:     Head: Atraumatic.     Nose: Nose normal.  Eyes:     Extraocular Movements: Extraocular movements intact.  Cardiovascular:     Pulses: Normal pulses.  Pulmonary:     Effort: Pulmonary effort is normal.  Abdominal:     Palpations: Abdomen is soft.  Musculoskeletal:     Cervical back: Neck supple.  Skin:    General: Skin is warm.     Capillary Refill: Capillary refill takes less than 2 seconds.  Neurological:     Mental Status: She is alert. Mental status is at baseline.  Psychiatric:        Behavior: Behavior normal.        Thought Content: Thought content normal.        Judgment: Judgment normal.     Ortho Exam Exam  of the right shoulder is unchanged from prior visit. Exam of the right elbow shows tenderness to the lateral epicondyle.  Pain with resisted wrist extension and ECRB extension.  Radial tunnel nontender.  Full range of motion of the elbow.  Collateral ligaments are stable. Specialty Comments:  No specialty comments available.  Imaging: No results found.   PMFS History: Patient Active Problem List   Diagnosis Date Noted   Acute sinusitis 01/21/2018   GI symptoms 06/26/2016   Urticaria 12/21/2015   Other allergic rhinitis 08/09/2015   History reviewed. No pertinent past medical history.  Family History  Problem Relation Age of Onset   Allergic rhinitis Neg Hx    Angioedema Neg Hx    Asthma Neg Hx    Atopy Neg Hx    Eczema Neg Hx    Immunodeficiency Neg Hx    Urticaria Neg Hx    Breast cancer Neg Hx     Past  Surgical History:  Procedure Laterality Date   RHINOPLASTY     SINOSCOPY     TUBAL LIGATION     Social History   Occupational History   Not on file  Tobacco Use   Smoking status: Never    Passive exposure: Never   Smokeless tobacco: Never  Vaping Use   Vaping status: Never Used  Substance and Sexual Activity   Alcohol use: No    Alcohol/week: 0.0 standard drinks of alcohol   Drug use: No   Sexual activity: Not on file

## 2024-02-06 ENCOUNTER — Ambulatory Visit (INDEPENDENT_AMBULATORY_CARE_PROVIDER_SITE_OTHER): Payer: Self-pay

## 2024-02-06 DIAGNOSIS — J309 Allergic rhinitis, unspecified: Secondary | ICD-10-CM | POA: Diagnosis not present

## 2024-02-11 ENCOUNTER — Ambulatory Visit (INDEPENDENT_AMBULATORY_CARE_PROVIDER_SITE_OTHER): Payer: Self-pay

## 2024-02-11 DIAGNOSIS — J309 Allergic rhinitis, unspecified: Secondary | ICD-10-CM

## 2024-02-18 ENCOUNTER — Ambulatory Visit (INDEPENDENT_AMBULATORY_CARE_PROVIDER_SITE_OTHER)

## 2024-02-18 DIAGNOSIS — J309 Allergic rhinitis, unspecified: Secondary | ICD-10-CM | POA: Diagnosis not present

## 2024-02-25 ENCOUNTER — Ambulatory Visit (INDEPENDENT_AMBULATORY_CARE_PROVIDER_SITE_OTHER): Payer: Self-pay

## 2024-02-25 DIAGNOSIS — J309 Allergic rhinitis, unspecified: Secondary | ICD-10-CM | POA: Diagnosis not present

## 2024-02-27 ENCOUNTER — Ambulatory Visit: Payer: Managed Care, Other (non HMO) | Admitting: Allergy & Immunology

## 2024-03-17 ENCOUNTER — Ambulatory Visit (INDEPENDENT_AMBULATORY_CARE_PROVIDER_SITE_OTHER)

## 2024-03-17 DIAGNOSIS — J309 Allergic rhinitis, unspecified: Secondary | ICD-10-CM

## 2024-04-07 ENCOUNTER — Ambulatory Visit (INDEPENDENT_AMBULATORY_CARE_PROVIDER_SITE_OTHER): Payer: Self-pay

## 2024-04-07 DIAGNOSIS — J309 Allergic rhinitis, unspecified: Secondary | ICD-10-CM | POA: Diagnosis not present

## 2024-04-16 ENCOUNTER — Other Ambulatory Visit: Payer: Self-pay

## 2024-04-16 ENCOUNTER — Ambulatory Visit: Admitting: Allergy & Immunology

## 2024-04-16 VITALS — BP 100/60 | HR 66 | Temp 98.3°F

## 2024-04-16 DIAGNOSIS — J302 Other seasonal allergic rhinitis: Secondary | ICD-10-CM

## 2024-04-16 DIAGNOSIS — J3089 Other allergic rhinitis: Secondary | ICD-10-CM | POA: Diagnosis not present

## 2024-04-16 DIAGNOSIS — L239 Allergic contact dermatitis, unspecified cause: Secondary | ICD-10-CM

## 2024-04-16 MED ORDER — MONTELUKAST SODIUM 10 MG PO TABS: 10.0000 mg | ORAL_TABLET | Freq: Every day | ORAL | 4 refills | Status: AC

## 2024-04-16 NOTE — Patient Instructions (Addendum)
 1. Perennial and seasonal allergy rhinitis - Continue with cetirizine 10mg  daily and montelukast  10mg  daily.  - On with Pepcid and Claritin on the days of your shots.  - Epi-rinses seem to be helping.  - We can continue with the allergy shots monthly.   2. Return in about 1 year (around 04/16/2025). You can have the follow up appointment with Dr. Idolina Maker or a Nurse Practicioner (our Nurse Practitioners are excellent and always have Physician oversight!).    Please inform us  of any Emergency Department visits, hospitalizations, or changes in symptoms. Call us  before going to the ED for breathing or allergy symptoms since we might be able to fit you in for a sick visit. Feel free to contact us  anytime with any questions, problems, or concerns.  It was a pleasure to see you again today!  Websites that have reliable patient information: 1. American Academy of Asthma, Allergy, and Immunology: www.aaaai.org 2. Food Allergy Research and Education (FARE): foodallergy.org 3. Mothers of Asthmatics: http://www.asthmacommunitynetwork.org 4. American College of Allergy, Asthma, and Immunology: www.acaai.org      "Like" us  on Facebook and Instagram for our latest updates!      A healthy democracy works best when [employer] participate! Make sure you are registered to vote! If you have moved or changed any of your contact information, you will need to get this updated before voting! Scan the QR codes below to learn more!

## 2024-04-16 NOTE — Progress Notes (Unsigned)
 FOLLOW UP  Date of Service/Encounter:  04/16/24   Assessment:   Perennial and seasonal allergic rhinitis - on allergen immunotherapy   Left arm numbness from an injection earlier this month   Starting epinephrine  rinses now and will try to advance on Schedule A in January 2023  Plan/Recommendations:   There are no Patient Instructions on file for this visit.   Subjective:   Breanna Fuller is a 48 y.o. female presenting today for follow up of  Chief Complaint  Patient presents with   Follow-up    Allergies No concerns    Breanna Fuller has a history of the following: Patient Active Problem List   Diagnosis Date Noted   Acute sinusitis 01/21/2018   GI symptoms 06/26/2016   Urticaria 12/21/2015   Other allergic rhinitis 08/09/2015    History obtained from: chart review and {Persons; PED relatives w/patient:19415::patient}.  Discussed the use of AI scribe software for clinical note transcription with the patient and/or guardian, who gave verbal consent to proceed.  Breanna Fuller is a 48 y.o. female presenting for {Blank single:19197::a food challenge,a drug challenge,skin testing,a sick visit,an evaluation of ***,a follow up visit}. She was last seen in August 2024. At that time, we decided to do repeat environmental allergy testing. We continued with cetirizine 10mg  daily and montelukast  10mg  daily.   Since the last visit,   Asthma/Respiratory Symptom History: ***  Allergic Rhinitis Symptom History: ***  Breanna Fuller is on allergen immunotherapy. She receives two injections. Immunotherapy script #1 contains trees and dust mites. She currently receives 0.25mL of the RED vial (1/100). Immunotherapy script #2 contains molds, cat and cockroach. She currently receives 0.25mL of the RED vial (1/100). Immunotherapy script #3 contains grasses and weeds. She currently receives 0.2 mL of the RED vial (1/100). She started shots in 2016 or so and reached maintenance  in sometime in 2020 or so (reached Red Vial at various time courses).     Food Allergy Symptom History: ***  Skin Symptom History: ***  GERD Symptom History: ***  Infection Symptom History: ***  Otherwise, there have been no changes to her past medical history, surgical history, family history, or social history.    Review of systems otherwise negative other than that mentioned in the HPI.    Objective:   Blood pressure 100/60, pulse 66, temperature 98.3 F (36.8 C), last menstrual period 12/10/2010, SpO2 96%. There is no height or weight on file to calculate BMI.    Physical Exam   Diagnostic studies: {Blank single:19197::none,deferred due to recent antihistamine use,deferred due to insurance stipulations that require a separate visit for testing,labs sent instead, }  Spirometry: {Blank single:19197::results normal (FEV1: ***%, FVC: ***%, FEV1/FVC: ***%),results abnormal (FEV1: ***%, FVC: ***%, FEV1/FVC: ***%)}.    {Blank single:19197::Spirometry consistent with mild obstructive disease,Spirometry consistent with moderate obstructive disease,Spirometry consistent with severe obstructive disease,Spirometry consistent with possible restrictive disease,Spirometry consistent with mixed obstructive and restrictive disease,Spirometry uninterpretable due to technique,Spirometry consistent with normal pattern}. {Blank single:19197::Albuterol/Atrovent nebulizer,Xopenex/Atrovent nebulizer,Albuterol nebulizer,Albuterol four puffs via MDI,Xopenex four puffs via MDI} treatment given in clinic with {Blank single:19197::significant improvement in FEV1 per ATS criteria,significant improvement in FVC per ATS criteria,significant improvement in FEV1 and FVC per ATS criteria,improvement in FEV1, but not significant per ATS criteria,improvement in FVC, but not significant per ATS criteria,improvement in FEV1 and FVC, but not significant per ATS  criteria,no improvement}.  Allergy Studies: {Blank single:19197::none,deferred due to recent antihistamine use,deferred due to insurance stipulations that require a separate visit for testing,labs  sent instead, }    {Blank single:19197::Allergy testing results were read and interpreted by myself, documented by clinical staff., }      Breanna Shaggy, MD  [facility] 

## 2024-04-21 ENCOUNTER — Encounter: Payer: Self-pay | Admitting: Allergy & Immunology

## 2024-05-05 ENCOUNTER — Ambulatory Visit (INDEPENDENT_AMBULATORY_CARE_PROVIDER_SITE_OTHER)

## 2024-05-05 DIAGNOSIS — J309 Allergic rhinitis, unspecified: Secondary | ICD-10-CM | POA: Diagnosis not present

## 2024-06-02 ENCOUNTER — Ambulatory Visit (INDEPENDENT_AMBULATORY_CARE_PROVIDER_SITE_OTHER)

## 2024-06-02 DIAGNOSIS — J309 Allergic rhinitis, unspecified: Secondary | ICD-10-CM | POA: Diagnosis not present

## 2024-06-30 ENCOUNTER — Ambulatory Visit (INDEPENDENT_AMBULATORY_CARE_PROVIDER_SITE_OTHER)

## 2024-06-30 DIAGNOSIS — J309 Allergic rhinitis, unspecified: Secondary | ICD-10-CM | POA: Diagnosis not present

## 2024-07-02 ENCOUNTER — Other Ambulatory Visit: Payer: Self-pay | Admitting: Family Medicine

## 2024-07-02 DIAGNOSIS — Z1231 Encounter for screening mammogram for malignant neoplasm of breast: Secondary | ICD-10-CM

## 2024-07-14 DIAGNOSIS — J3089 Other allergic rhinitis: Secondary | ICD-10-CM | POA: Diagnosis not present

## 2024-07-14 DIAGNOSIS — J301 Allergic rhinitis due to pollen: Secondary | ICD-10-CM | POA: Diagnosis not present

## 2024-07-14 NOTE — Progress Notes (Signed)
 VIALS MADE 07-14-24

## 2024-07-27 ENCOUNTER — Ambulatory Visit

## 2024-07-28 ENCOUNTER — Ambulatory Visit (INDEPENDENT_AMBULATORY_CARE_PROVIDER_SITE_OTHER)

## 2024-07-28 DIAGNOSIS — J309 Allergic rhinitis, unspecified: Secondary | ICD-10-CM

## 2024-08-04 ENCOUNTER — Ambulatory Visit
Admission: RE | Admit: 2024-08-04 | Discharge: 2024-08-04 | Disposition: A | Discharge status: Home / Self Care | Source: Ambulatory Visit | Attending: Family Medicine | Admitting: Family Medicine

## 2024-08-04 DIAGNOSIS — Z1231 Encounter for screening mammogram for malignant neoplasm of breast: Secondary | ICD-10-CM

## 2024-08-25 ENCOUNTER — Ambulatory Visit

## 2024-08-25 DIAGNOSIS — J309 Allergic rhinitis, unspecified: Secondary | ICD-10-CM | POA: Diagnosis not present

## 2024-08-31 ENCOUNTER — Encounter: Payer: Self-pay | Admitting: Radiology

## 2024-09-09 ENCOUNTER — Encounter: Payer: Self-pay | Admitting: Orthopaedic Surgery

## 2024-09-16 ENCOUNTER — Ambulatory Visit: Admitting: Orthopaedic Surgery

## 2024-09-16 ENCOUNTER — Encounter: Payer: Self-pay | Admitting: Orthopaedic Surgery

## 2024-10-01 ENCOUNTER — Ambulatory Visit

## 2024-10-01 DIAGNOSIS — J309 Allergic rhinitis, unspecified: Secondary | ICD-10-CM | POA: Diagnosis not present

## 2024-10-02 ENCOUNTER — Encounter: Payer: Self-pay | Admitting: Orthopaedic Surgery

## 2024-10-02 ENCOUNTER — Ambulatory Visit: Admitting: Orthopaedic Surgery

## 2024-10-02 DIAGNOSIS — M25521 Pain in right elbow: Secondary | ICD-10-CM

## 2024-10-02 MED ORDER — METHYLPREDNISOLONE 4 MG PO TBPK: ORAL_TABLET | ORAL | 0 refills | Status: AC

## 2024-10-02 NOTE — Progress Notes (Signed)
 Office Visit Note   Patient: Breanna Fuller           Date of Birth: 03/04/1976           MRN: [id number] Visit Date: 10/02/2024              Requested by: Ernesto Elspeth PARAS, MD [address] [place],  [place] [address] PCP: Ernesto Elspeth PARAS, MD   Assessment & Plan: Visit Diagnoses:  1. Pain in right elbow     Plan: Impression is recalcitrant right lateral epicondylitis.  Treatment options discussed and we will move forward with an MRI to rule out structural abnormalities.  Follow-up after the MRI.  Follow-Up Instructions: No follow-ups on file.   Orders:  Orders Placed This Encounter  Procedures   MR Elbow Right w/o contrast   No orders of the defined types were placed in this encounter.     Procedures: No procedures performed   Clinical Data: No additional findings.   Subjective: Chief Complaint  Patient presents with   Right Elbow - Follow-up    HPI [name] returns today for recurrent lateral right elbow pain due to lateral epicondylitis.  We saw her last time in April and did a cortisone injection which helped until a few weeks ago now the pain is worse.  It is affecting ADLs. Review of Systems  Constitutional: Negative.   HENT: Negative.    Eyes: Negative.   Respiratory: Negative.    Cardiovascular: Negative.   Endocrine: Negative.   Musculoskeletal: Negative.   Neurological: Negative.   Hematological: Negative.   Psychiatric/Behavioral: Negative.    All other systems reviewed and are negative.    Objective: Vital Signs: LMP 12/10/2010   Physical Exam Vitals and nursing note reviewed.  Constitutional:      Appearance: She is well-developed.  HENT:     Head: Atraumatic.     Nose: Nose normal.  Eyes:     Extraocular Movements: Extraocular movements intact.  Cardiovascular:     Pulses: Normal pulses.  Pulmonary:     Effort: Pulmonary effort is normal.  Abdominal:     Palpations: Abdomen is soft.  Musculoskeletal:     Cervical  back: Neck supple.  Skin:    General: Skin is warm.     Capillary Refill: Capillary refill takes less than 2 seconds.  Neurological:     Mental Status: She is alert. Mental status is at baseline.  Psychiatric:        Behavior: Behavior normal.        Thought Content: Thought content normal.        Judgment: Judgment normal.     Ortho Exam Exam shows a positive Maudsley's sign.  Normal elbow range of motion. Specialty Comments:  No specialty comments available.  Imaging: No results found.   PMFS History: Patient Active Problem List   Diagnosis Date Noted   Acute sinusitis 01/21/2018   GI symptoms 06/26/2016   Urticaria 12/21/2015   Other allergic rhinitis 08/09/2015   No past medical history on file.  Family History  Problem Relation Age of Onset   Allergic rhinitis Neg Hx    Angioedema Neg Hx    Asthma Neg Hx    Atopy Neg Hx    Eczema Neg Hx    Immunodeficiency Neg Hx    Urticaria Neg Hx    Breast cancer Neg Hx     Past Surgical History:  Procedure Laterality Date   RHINOPLASTY     SINOSCOPY  TUBAL LIGATION     Social History   Occupational History   Not on file  Tobacco Use   Smoking status: Never    Passive exposure: Never   Smokeless tobacco: Never  Vaping Use   Vaping status: Never Used  Substance and Sexual Activity   Alcohol use: No    Alcohol/week: 0.0 standard drinks of alcohol   Drug use: No   Sexual activity: Not on file

## 2024-10-03 ENCOUNTER — Other Ambulatory Visit

## 2024-10-05 ENCOUNTER — Encounter: Payer: Self-pay | Admitting: Orthopaedic Surgery

## 2024-10-06 ENCOUNTER — Telehealth: Payer: Self-pay

## 2024-10-06 ENCOUNTER — Ambulatory Visit
Admission: RE | Admit: 2024-10-06 | Discharge: 2024-10-06 | Disposition: A | Source: Ambulatory Visit | Attending: Orthopaedic Surgery | Admitting: Orthopaedic Surgery

## 2024-10-06 ENCOUNTER — Other Ambulatory Visit: Payer: Self-pay

## 2024-10-06 DIAGNOSIS — M25521 Pain in right elbow: Secondary | ICD-10-CM

## 2024-10-06 NOTE — Telephone Encounter (Signed)
 Hey, Dr.Xu ordered an MRI of her elbow. Insurance required [employer]. They were performed today at [facility]. Anything we need to do now in order to get this approved and scheduled? Patient works at [employer].

## 2024-10-06 NOTE — Telephone Encounter (Signed)
 Please order xray of right elbow please at [facility].  Thanks.

## 2024-10-08 ENCOUNTER — Ambulatory Visit (INDEPENDENT_AMBULATORY_CARE_PROVIDER_SITE_OTHER)

## 2024-10-08 DIAGNOSIS — J309 Allergic rhinitis, unspecified: Secondary | ICD-10-CM | POA: Diagnosis not present

## 2024-10-09 ENCOUNTER — Ambulatory Visit
Admission: RE | Admit: 2024-10-09 | Discharge: 2024-10-09 | Disposition: A | Source: Ambulatory Visit | Attending: Orthopaedic Surgery | Admitting: Orthopaedic Surgery

## 2024-10-09 DIAGNOSIS — M25521 Pain in right elbow: Secondary | ICD-10-CM

## 2024-10-12 ENCOUNTER — Encounter: Payer: Self-pay | Admitting: Orthopaedic Surgery

## 2024-10-13 ENCOUNTER — Ambulatory Visit: Admitting: Orthopaedic Surgery

## 2024-10-13 DIAGNOSIS — M7711 Lateral epicondylitis, right elbow: Secondary | ICD-10-CM | POA: Insufficient documentation

## 2024-10-13 NOTE — Telephone Encounter (Signed)
 Sure let's work her in at 1 pm.  Thanks.

## 2024-10-13 NOTE — Progress Notes (Signed)
 Office Visit Note   Patient: Breanna Fuller           Date of Birth: 1976/01/10           MRN: [id number] Visit Date: 10/13/2024              Requested by: Ernesto Elspeth PARAS, MD [address] [place],  [place] [address] PCP: Ernesto Elspeth PARAS, MD   Assessment & Plan: Visit Diagnoses:  1. Lateral epicondylitis, right elbow     Plan: History of Present Illness Breanna Fuller is a 48 year old right hand dominant female who presents for MRI review of the right elbow.  Her symptoms affect all aspects of her ADLs.  She has not had surgery or other targeted treatment for this injury. She denies numbness, tingling, or additional weakness and is currently unable to perform her usual work duties due to right wrist symptoms.  Examination of the right elbow shows tenderness to the lateral epicondyle.  Pain with Maudsley's sign.  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 Surgical intervention indicated to restore function. - Based on findings surgical debridement and repair recommended and detailed surgical plan discussed including risk benefits and recovery and anticipated time out of work - Breanna Fuller will call her to confirm surgery date.  Follow-Up Instructions: No follow-ups on file.   Orders:  No orders of the defined types were placed in this encounter.  No orders of the defined types were placed in this encounter.     Procedures: No procedures performed   Clinical Data: No additional findings.   Subjective: Chief Complaint  Patient presents with   Right Elbow - Follow-up    HPI  Review of Systems  Constitutional: Negative.   HENT: Negative.    Eyes: Negative.   Respiratory: Negative.    Cardiovascular: Negative.   Endocrine: Negative.   Musculoskeletal: Negative.   Neurological: Negative.   Hematological: Negative.   Psychiatric/Behavioral:  Negative.    All other systems reviewed and are negative.    Objective: Vital Signs: LMP 12/10/2010   Physical Exam Vitals and nursing note reviewed.  Constitutional:      Appearance: She is well-developed.  HENT:     Head: Atraumatic.     Nose: Nose normal.  Eyes:     Extraocular Movements: Extraocular movements intact.  Cardiovascular:     Pulses: Normal pulses.  Pulmonary:     Effort: Pulmonary effort is normal.  Abdominal:     Palpations: Abdomen is soft.  Musculoskeletal:     Cervical back: Neck supple.  Skin:    General: Skin is warm.     Capillary Refill: Capillary refill takes less than 2 seconds.  Neurological:     Mental Status: She is alert. Mental status is at baseline.  Psychiatric:        Behavior: Behavior normal.        Thought Content: Thought content normal.        Judgment: Judgment normal.     Ortho Exam  Specialty Comments:  No specialty comments available.  Imaging: No results found.   PMFS History: Patient Active Problem List   Diagnosis Date Noted   Lateral epicondylitis, right elbow 10/13/2024   Acute sinusitis 01/21/2018   GI symptoms 06/26/2016   Urticaria 12/21/2015   Other allergic rhinitis 08/09/2015   No past medical history on file.  Family History  Problem Relation  Age of Onset   Allergic rhinitis Neg Hx    Angioedema Neg Hx    Asthma Neg Hx    Atopy Neg Hx    Eczema Neg Hx    Immunodeficiency Neg Hx    Urticaria Neg Hx    Breast cancer Neg Hx     Past Surgical History:  Procedure Laterality Date   RHINOPLASTY     SINOSCOPY     TUBAL LIGATION     Social History   Occupational History   Not on file  Tobacco Use   Smoking status: Never    Passive exposure: Never   Smokeless tobacco: Never  Vaping Use   Vaping status: Never Used  Substance and Sexual Activity   Alcohol use: No    Alcohol/week: 0.0 standard drinks of alcohol   Drug use: No   Sexual activity: Not on file

## 2024-10-13 NOTE — Telephone Encounter (Signed)
 Appt changed

## 2024-10-14 ENCOUNTER — Telehealth: Payer: Self-pay | Admitting: Orthopaedic Surgery

## 2024-10-14 NOTE — Telephone Encounter (Signed)
 Pt called stating she is waiting for a call to schedule surgery. Pt states Xu told her next week. Please call pt at [phone].

## 2024-10-16 ENCOUNTER — Other Ambulatory Visit: Payer: Self-pay | Admitting: Physician Assistant

## 2024-10-16 MED ORDER — ONDANSETRON HCL 4 MG PO TABS: 4.0000 mg | ORAL_TABLET | Freq: Three times a day (TID) | ORAL | 0 refills | Status: AC | PRN

## 2024-10-16 MED ORDER — HYDROCODONE-ACETAMINOPHEN 5-325 MG PO TABS: 1.0000 | ORAL_TABLET | Freq: Three times a day (TID) | ORAL | 0 refills | Status: AC | PRN

## 2024-10-17 ENCOUNTER — Other Ambulatory Visit

## 2024-10-20 ENCOUNTER — Other Ambulatory Visit: Payer: Self-pay

## 2024-10-20 ENCOUNTER — Encounter (HOSPITAL_COMMUNITY): Payer: Self-pay | Admitting: Orthopaedic Surgery

## 2024-10-20 NOTE — Progress Notes (Signed)
 SDW CALL  Patient was given pre-op instructions over the phone. The opportunity was given for the patient to ask questions. No further questions asked. Patient verbalized understanding of instructions given.   PCP - Ernesto Elspeth PARAS, MD  Cardiologist - denies  PPM/ICD - denies Device Orders -  Rep Notified -   Chest x-ray - na EKG - na Stress Test - denies ECHO - denies Cardiac Cath - denies  Sleep Study - denies CPAP - no  Fasting Blood Sugar - na Checks Blood Sugar _____ times a day  Blood Thinner Instructions:na Aspirin Instructions:na  ERAS Protcol -clear liquids until 0900 PRE-SURGERY Ensure or G2- no  COVID TEST- na   Anesthesia review: no  Patient denies shortness of breath, fever, cough and chest pain over the phone call  As of today, STOP taking any Aspirin (unless otherwise instructed by your surgeon) Aleve, Naproxen, Ibuprofen, Motrin, Advil, Goody's, BC's, all herbal medications, fish oil, and all vitamins. Including Diclofenac (Voltaren ).  Special instructions:    Oral Hygiene is also important to reduce your risk of infection.  Remember - BRUSH YOUR TEETH THE MORNING OF SURGERY WITH YOUR REGULAR TOOTHPASTE

## 2024-10-21 ENCOUNTER — Encounter (HOSPITAL_COMMUNITY)
Admission: RE | Disposition: A | Discharge status: Home / Self Care | Payer: Self-pay | Source: Home / Self Care | Attending: Orthopaedic Surgery

## 2024-10-21 ENCOUNTER — Other Ambulatory Visit: Payer: Self-pay

## 2024-10-21 ENCOUNTER — Ambulatory Visit (HOSPITAL_COMMUNITY)
Admission: RE | Admit: 2024-10-21 | Discharge: 2024-10-21 | Disposition: A | Discharge status: Home / Self Care | Attending: Orthopaedic Surgery | Admitting: Orthopaedic Surgery

## 2024-10-21 ENCOUNTER — Encounter (HOSPITAL_COMMUNITY): Payer: Self-pay | Admitting: Orthopaedic Surgery

## 2024-10-21 ENCOUNTER — Ambulatory Visit (HOSPITAL_COMMUNITY): Admitting: Anesthesiology

## 2024-10-21 ENCOUNTER — Ambulatory Visit: Admitting: Orthopaedic Surgery

## 2024-10-21 DIAGNOSIS — M7711 Lateral epicondylitis, right elbow: Secondary | ICD-10-CM

## 2024-10-21 DIAGNOSIS — S56511A Strain of other extensor muscle, fascia and tendon at forearm level, right arm, initial encounter: Secondary | ICD-10-CM | POA: Insufficient documentation

## 2024-10-21 DIAGNOSIS — X58XXXA Exposure to other specified factors, initial encounter: Secondary | ICD-10-CM | POA: Insufficient documentation

## 2024-10-21 HISTORY — PX: TENNIS ELBOW RELEASE/NIRSCHEL PROCEDURE: SHX6651

## 2024-10-21 HISTORY — DX: Nausea with vomiting, unspecified: R11.2

## 2024-10-21 HISTORY — DX: Anxiety disorder, unspecified: F41.9

## 2024-10-21 LAB — CBC
HCT: 46.7 % — ABNORMAL HIGH (ref 36.0–46.0)
Hemoglobin: 15 g/dL (ref 12.0–15.0)
MCH: 30.5 pg (ref 26.0–34.0)
MCHC: 32.1 g/dL (ref 30.0–36.0)
MCV: 94.9 fL (ref 80.0–100.0)
Platelets: 241 K/uL (ref 150–400)
RBC: 4.92 MIL/uL (ref 3.87–5.11)
RDW: 12.5 % (ref 11.5–15.5)
WBC: 6.7 K/uL (ref 4.0–10.5)
nRBC: 0 % (ref 0.0–0.2)

## 2024-10-21 SURGERY — TENNIS ELBOW RELEASE/NIRSCHEL PROCEDURE
Anesthesia: Monitor Anesthesia Care | Site: Elbow | Laterality: Right

## 2024-10-21 MED ORDER — CEFAZOLIN SODIUM-DEXTROSE 2-4 GM/100ML-% IV SOLN
2.0000 g | INTRAVENOUS | Status: AC
  Administered 2024-10-21: 2 g via INTRAVENOUS
  Filled 2024-10-21: qty 100

## 2024-10-21 MED ORDER — FENTANYL CITRATE (PF) 100 MCG/2ML IJ SOLN: 100.0000 ug | Freq: Once | INTRAMUSCULAR | Status: AC

## 2024-10-21 MED ORDER — MIDAZOLAM HCL (PF) 2 MG/2ML IJ SOLN: 2.0000 mg | Freq: Once | INTRAMUSCULAR | Status: AC

## 2024-10-21 MED ORDER — LACTATED RINGERS IV SOLN: INTRAVENOUS | Status: DC

## 2024-10-21 MED ORDER — CHLORHEXIDINE GLUCONATE 0.12 % MT SOLN
15.0000 mL | Freq: Once | OROMUCOSAL | Status: AC
  Administered 2024-10-21: 15 mL via OROMUCOSAL
  Filled 2024-10-21: qty 15

## 2024-10-21 MED ORDER — BUPIVACAINE-EPINEPHRINE (PF) 0.5% -1:200000 IJ SOLN
INTRAMUSCULAR | Status: DC | PRN
  Administered 2024-10-21: 30 mL via PERINEURAL

## 2024-10-21 MED ORDER — LACTATED RINGERS IV SOLN: INTRAVENOUS | Status: DC | PRN

## 2024-10-21 MED ORDER — ONDANSETRON HCL 4 MG/2ML IJ SOLN
INTRAMUSCULAR | Status: DC | PRN
  Administered 2024-10-21: 4 mg via INTRAVENOUS

## 2024-10-21 MED ORDER — DEXMEDETOMIDINE HCL IN NACL 80 MCG/20ML IV SOLN
INTRAVENOUS | Status: DC | PRN
  Administered 2024-10-21: 8 ug via INTRAVENOUS

## 2024-10-21 MED ORDER — PROPOFOL 10 MG/ML IV BOLUS
INTRAVENOUS | Status: DC | PRN
  Administered 2024-10-21: 30 mg via INTRAVENOUS

## 2024-10-21 MED ORDER — FENTANYL CITRATE (PF) 100 MCG/2ML IJ SOLN
INTRAMUSCULAR | Status: AC
  Administered 2024-10-21: 100 ug via INTRAVENOUS
  Filled 2024-10-21: qty 2

## 2024-10-21 MED ORDER — SODIUM CHLORIDE 0.9 % IR SOLN
Status: DC | PRN
  Administered 2024-10-21: 1000 mL

## 2024-10-21 MED ORDER — ACETAMINOPHEN 500 MG PO TABS
1000.0000 mg | ORAL_TABLET | Freq: Once | ORAL | Status: AC
  Administered 2024-10-21: 1000 mg via ORAL
  Filled 2024-10-21: qty 2

## 2024-10-21 MED ORDER — PROPOFOL 500 MG/50ML IV EMUL
INTRAVENOUS | Status: DC | PRN
  Administered 2024-10-21: 150 ug/kg/min via INTRAVENOUS

## 2024-10-21 MED ORDER — ORAL CARE MOUTH RINSE: 15.0000 mL | Freq: Once | OROMUCOSAL | Status: AC

## 2024-10-21 MED ORDER — MIDAZOLAM HCL 2 MG/2ML IJ SOLN
INTRAMUSCULAR | Status: AC
  Administered 2024-10-21: 2 mg via INTRAVENOUS
  Filled 2024-10-21: qty 2

## 2024-10-21 MED ORDER — PROPOFOL 10 MG/ML IV BOLUS
INTRAVENOUS | Status: AC
  Filled 2024-10-21: qty 20

## 2024-10-21 MED ORDER — HYDROMORPHONE HCL 1 MG/ML IJ SOLN: 0.2500 mg | INTRAMUSCULAR | Status: DC | PRN

## 2024-10-21 MED ORDER — PHENYLEPHRINE 80 MCG/ML (10ML) SYRINGE FOR IV PUSH (FOR BLOOD PRESSURE SUPPORT)
PREFILLED_SYRINGE | INTRAVENOUS | Status: DC | PRN
  Administered 2024-10-21 (×3): 160 ug via INTRAVENOUS

## 2024-10-21 SURGICAL SUPPLY — 39 items
ANCH SUT JUGGERKNOT 2X1.45 BL (Anchor) IMPLANT
BAG COUNTER SPONGE SURGICOUNT (BAG) ×1 IMPLANT
BNDG COMPR ESMARK 4X3 LF (GAUZE/BANDAGES/DRESSINGS) ×1 IMPLANT
BNDG ELASTIC 3INX 5YD STR LF (GAUZE/BANDAGES/DRESSINGS) IMPLANT
BNDG STRETCH GAUZE 3IN X12FT (GAUZE/BANDAGES/DRESSINGS) IMPLANT
CLSR STERI-STRIP ANTIMIC 1/2X4 (GAUZE/BANDAGES/DRESSINGS) IMPLANT
CORD BIPOLAR FORCEPS 12FT (ELECTRODE) IMPLANT
COVER SURGICAL LIGHT HANDLE (MISCELLANEOUS) ×1 IMPLANT
CUFF TOURN SGL QUICK 18X4 (TOURNIQUET CUFF) IMPLANT
DRAPE SURG 17X23 STRL (DRAPES) IMPLANT
DRSG XEROFORM 1X8 (GAUZE/BANDAGES/DRESSINGS) IMPLANT
DURAPREP 26ML APPLICATOR (WOUND CARE) ×1 IMPLANT
ELECTRODE REM PT RTRN 9FT ADLT (ELECTROSURGICAL) IMPLANT
GAUZE SPONGE 4X4 12PLY STRL (GAUZE/BANDAGES/DRESSINGS) ×1 IMPLANT
GAUZE XEROFORM 1X8 LF (GAUZE/BANDAGES/DRESSINGS) ×1 IMPLANT
GLOVE BIOGEL PI IND STRL 7.5 (GLOVE) ×1 IMPLANT
GLOVE BIOGEL PI MICRO STRL 7 (GLOVE) ×2 IMPLANT
GLOVE INDICATOR 7.0 STRL GRN (GLOVE) ×1 IMPLANT
GLOVE SURG SYN 7.5 PF PI (GLOVE) ×2 IMPLANT
GOWN STRL REUS W/ TWL LRG LVL3 (GOWN DISPOSABLE) ×1 IMPLANT
GOWN STRL SURGICAL XL XLNG (GOWN DISPOSABLE) ×2 IMPLANT
JUGGERKNOT 1.4 SHORT SNG LOAD (BIT) IMPLANT
KIT BASIN OR (CUSTOM PROCEDURE TRAY) ×1 IMPLANT
KIT TURNOVER KIT B (KITS) ×1 IMPLANT
PACK ORTHO EXTREMITY (CUSTOM PROCEDURE TRAY) ×1 IMPLANT
PAD ARMBOARD POSITIONER FOAM (MISCELLANEOUS) ×1 IMPLANT
PADDING CAST ABS COTTON 3X4 (CAST SUPPLIES) IMPLANT
PADDING CAST ABS COTTON 4X4 ST (CAST SUPPLIES) ×2 IMPLANT
SLING ARM FOAM STRAP MED (SOFTGOODS) IMPLANT
SOLN 0.9% NACL POUR BTL 1000ML (IV SOLUTION) ×2 IMPLANT
SOLN STERILE WATER BTL 1000 ML (IV SOLUTION) ×1 IMPLANT
SPLINT FIBERGLASS 3X35 (CAST SUPPLIES) IMPLANT
SUT ETHILON 4 0 PS 2 18 (SUTURE) ×1 IMPLANT
SUT MNCRL AB 3-0 PS2 18 (SUTURE) IMPLANT
SUT VIC AB 3-0 SH 27X BRD (SUTURE) IMPLANT
SYR CONTROL 10ML LL (SYRINGE) IMPLANT
TOWEL GREEN STERILE (TOWEL DISPOSABLE) ×1 IMPLANT
TOWEL GREEN STERILE FF (TOWEL DISPOSABLE) ×1 IMPLANT
UNDERPAD 30X36 HEAVY ABSORB (UNDERPADS AND DIAPERS) ×2 IMPLANT

## 2024-10-21 NOTE — Anesthesia Preprocedure Evaluation (Addendum)
"                                    Anesthesia Evaluation  Patient identified by MRN, date of birth, ID band Patient awake    Reviewed: Allergy & Precautions, H&P , NPO status , Patient's Chart, lab work & pertinent test results  History of Anesthesia Complications (+) PONV and history of anesthetic complications  Airway Mallampati: II  TM Distance: >3 FB Neck ROM: Full    Dental no notable dental hx. (+) Teeth Intact, Dental Advisory Given   Pulmonary neg pulmonary ROS   Pulmonary exam normal breath sounds clear to auscultation       Cardiovascular negative cardio ROS  Rhythm:Regular Rate:Normal     Neuro/Psych   Anxiety     negative neurological ROS     GI/Hepatic negative GI ROS, Neg liver ROS,,,  Endo/Other  negative endocrine ROS    Renal/GU negative Renal ROS  negative genitourinary   Musculoskeletal   Abdominal   Peds  Hematology negative hematology ROS (+)   Anesthesia Other Findings   Reproductive/Obstetrics negative OB ROS                              Anesthesia Physical Anesthesia Plan  ASA: 2  Anesthesia Plan: MAC and Regional   Post-op Pain Management: Tylenol  PO (pre-op)*   Induction: Intravenous  PONV Risk Score and Plan: 3 and Propofol  infusion, Ondansetron  and Midazolam   Airway Management Planned: Natural Airway and Simple Face Mask  Additional Equipment:   Intra-op Plan:   Post-operative Plan:   Informed Consent: I have reviewed the patients History and Physical, chart, labs and discussed the procedure including the risks, benefits and alternatives for the proposed anesthesia with the patient or authorized representative who has indicated his/her understanding and acceptance.     Dental advisory given  Plan Discussed with: CRNA  Anesthesia Plan Comments:          Anesthesia Quick Evaluation  "

## 2024-10-21 NOTE — Anesthesia Postprocedure Evaluation (Signed)
"   Anesthesia Post Note  Patient: Ilaisaane L Bringhurst  Procedure(s) Performed: TENNIS ELBOW RELEASE/NIRSCHEL PROCEDURE (Right: Elbow)     Patient location during evaluation: PACU Anesthesia Type: Regional and MAC Level of consciousness: awake and alert Pain management: pain level controlled Vital Signs Assessment: post-procedure vital signs reviewed and stable Respiratory status: spontaneous breathing, nonlabored ventilation and respiratory function stable Cardiovascular status: stable and blood pressure returned to baseline Postop Assessment: no apparent nausea or vomiting Anesthetic complications: no   There were no known notable events for this encounter.  Last Vitals:  Vitals:   10/21/24 1215 10/21/24 1230  BP: (!) 111/52 106/60  Pulse: 67 63  Resp: (!) 9 15  Temp: 36.6 C 36.6 C  SpO2: 99% 99%    Last Pain:  Vitals:   10/21/24 1230  TempSrc:   PainSc: 0-No pain                 Kashif Pooler,W. EDMOND      "

## 2024-10-21 NOTE — Anesthesia Procedure Notes (Signed)
 Anesthesia Regional Block: Supraclavicular block   Pre-Anesthetic Checklist: , timeout performed,  Correct Patient, Correct Site, Correct Laterality,  Correct Procedure, Correct Position, site marked,  Risks and benefits discussed,  Pre-op evaluation,  At surgeon's request and post-op pain management  Laterality: Right  Prep: Maximum Sterile Barrier Precautions used, chloraprep       Needles:  Injection technique: Single-shot  Needle Type: Echogenic Stimulator Needle     Needle Length: 5cm  Needle Gauge: 22     Additional Needles:   Procedures:,,,, ultrasound used (permanent image in chart),,    Narrative:  Start time: 10/21/2024 10:35 AM End time: 10/21/2024 10:45 AM Injection made incrementally with aspirations every 5 mL.  Performed by: Personally  Anesthesiologist: Epifanio Fallow, MD

## 2024-10-21 NOTE — H&P (Signed)
 "   PREOPERATIVE H&P  Chief Complaint: right lateral epicondylitis  HPI: Breanna Fuller is a 48 y.o. female who presents for surgical treatment of right lateral epicondylitis.  She denies any changes in medical history.  Past Surgical History:  Procedure Laterality Date   RHINOPLASTY     SINOSCOPY     TUBAL LIGATION     VAGINAL HYSTERECTOMY  2020   included bladder sling and rectocele repair   Social History   Socioeconomic History   Marital status: Married    Spouse name: Not on file   Number of children: Not on file   Years of education: Not on file   Highest education level: Not on file  Occupational History   Not on file  Tobacco Use   Smoking status: Never    Passive exposure: Never   Smokeless tobacco: Never  Vaping Use   Vaping status: Never Used  Substance and Sexual Activity   Alcohol use: No    Comment: rare   Drug use: No   Sexual activity: Not on file  Other Topics Concern   Not on file  Social History Narrative   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   Social Drivers of Health   Tobacco Use: Low Risk (10/20/2024)   Patient History    Smoking Tobacco Use: Never    Smokeless Tobacco Use: Never    Passive Exposure: Never  Financial Resource Strain: Not on file  Food Insecurity: Low Risk (07/08/2024)   Received from [facility]   Epic    Within the past 12 months, the food you bought just didn't last and you didn't have money to get more. : Never true    Within the past 12 months, you worried that your food would run out before you got money to buy more: Never true  Transportation Needs: No Transportation Needs (07/08/2024)   Received from [facility]    In the past 12 months, has lack of reliable transportation kept you from medical appointments, meetings, work or from getting things needed for daily living? : No  Physical Activity: Not on file  Stress: Not on file  Social Connections: Not on file  Depression (EYV7-0):  Not on file  Alcohol Screen: Not on file  Housing: Low Risk (07/08/2024)   Received from [facility]   Epic    Think about the place you live. Do you have problems with any of the following? Choose all that apply:: None/None on this list    What is your living situation today?: I have a steady place to live  Utilities: Low Risk (07/08/2024)   Received from [facility]   Utilities    In the past 12 months has the electric, gas, oil, or water company threatened to shut off services in your home? : No  Health Literacy: Not on file   Family History  Problem Relation Age of Onset   Allergic rhinitis Neg Hx    Angioedema Neg Hx    Asthma Neg Hx    Atopy Neg Hx    Eczema Neg Hx    Immunodeficiency Neg Hx    Urticaria Neg Hx    Breast cancer Neg Hx    Allergies[1] Prior to Admission medications  Medication Sig Start Date End Date Taking? Authorizing Provider  acetaminophen  (TYLENOL ) 500 MG tablet Take 1,000 mg by mouth every 6 (six) hours as needed for moderate pain (pain score 4-6) or mild pain (pain score  1-3).   Yes [provider]  cyclobenzaprine (FLEXERIL) 5 MG tablet Take 5 mg by mouth daily as needed for muscle spasms. 09/19/21  Yes [provider]  HYDROcodone -acetaminophen  (NORCO/VICODIN) 5-325 MG tablet Take 1 tablet by mouth 3 (three) times daily as needed. To be taken after surgery Patient not taking: Reported on 10/16/2024 10/16/24   Jule Ronal CROME, PA-C  ibuprofen (ADVIL) 200 MG tablet Take 800 mg by mouth every 6 (six) hours as needed for mild pain (pain score 1-3) or moderate pain (pain score 4-6).   Yes [provider]  loratadine (CLARITIN) 10 MG tablet Take 10 mg by mouth daily.   Yes [provider]  omeprazole (PRILOSEC) 20 MG capsule Take 20 mg by mouth daily. 07/24/21  Yes [provider]  ondansetron  (ZOFRAN ) 4 MG tablet Take 1 tablet (4 mg total) by mouth every 8 (eight) hours as needed for nausea or  vomiting. Patient not taking: Reported on 10/16/2024 10/16/24   Jule Ronal CROME, PA-C  oxymetazoline (AFRIN) 0.05 % nasal spray Place 1 spray into both nostrils daily as needed for congestion.   Yes [provider]  polyethylene glycol (MIRALAX / GLYCOLAX) 17 g packet Take 17 g by mouth daily.   Yes [provider]  sodium chloride  (OCEAN) 0.65 % SOLN nasal spray Place 1 spray into both nostrils daily as needed for congestion.   Yes [provider]  UNABLE TO FIND Inject 1 each into the skin every 30 (thirty) days. Med Name: immunotherapy (allergy shots)   Yes [provider]  venlafaxine XR (EFFEXOR-XR) 150 MG 24 hr capsule Take 150 mg by mouth daily.   Yes [provider]  cetirizine (ZYRTEC) 10 MG tablet Take 10 mg by mouth daily. Patient not taking: Reported on 10/16/2024    [provider]  diclofenac  (VOLTAREN ) 75 MG EC tablet Take 1 tablet (75 mg total) by mouth 2 (two) times daily as needed. Patient not taking: Reported on 10/16/2024 11/13/22   Jerri Kay HERO, MD  EPINEPHrine  (EPIPEN  2-PAK) 0.3 mg/0.3 mL IJ SOAJ injection Inject 0.3 mg into the muscle as needed. 04/09/23   Cari Arlean HERO, FNP  fluconazole (DIFLUCAN) 150 MG tablet Take 150 mg by mouth See admin instructions. 1 tablet once for yeast infection; may repeat once in 3 days if needed Patient not taking: Reported on 10/16/2024 09/16/24   [provider]  montelukast  (SINGULAIR ) 10 MG tablet Take 1 tablet (10 mg total) by mouth daily. Patient not taking: Reported on 10/16/2024 04/16/24   Iva Marty Saltness, MD  Multiple Vitamin (MULTI-VITAMIN) tablet Take 1 tablet by mouth daily. Patient not taking: Reported on 10/16/2024    [provider]  triamcinolone  cream (KENALOG ) 0.1 % Apply topically. Patient not taking: Reported on 10/16/2024 11/05/18   [provider]     Positive ROS: All other systems have been reviewed and were otherwise negative with the  exception of those mentioned in the HPI and as above.  Physical Exam: General: Alert, no acute distress Cardiovascular: No pedal edema Respiratory: No cyanosis, no use of accessory musculature GI: abdomen soft Skin: No lesions in the area of chief complaint Neurologic: Sensation intact distally Psychiatric: Patient is competent for consent with normal mood and affect Lymphatic: no lymphedema  MUSCULOSKELETAL: exam stable  Assessment: right lateral epicondylitis  Plan: Plan for Procedures: TENNIS ELBOW RELEASE/NIRSCHEL PROCEDURE  The risks benefits and alternatives were discussed with the patient including but not limited to the risks  of nonoperative treatment, versus surgical intervention including infection, bleeding, nerve injury,  blood clots, cardiopulmonary complications, morbidity, mortality, among others, and they were willing to proceed.   Ozell Cummins, MD 10/21/2024 9:34 AM     [1]  Allergies Allergen Reactions   Vancomycin Hives   Gluten Meal Hives   Latex Itching   Monistat [Miconazole] Swelling   Terazol [Terconazole] Swelling   Wellbutrin [Bupropion] Rash   "

## 2024-10-21 NOTE — Transfer of Care (Signed)
 Immediate Anesthesia Transfer of Care Note  Patient: Breanna Fuller  Procedure(s) Performed: TENNIS ELBOW RELEASE/NIRSCHEL PROCEDURE (Right: Elbow)  Patient Location: PACU  Anesthesia Type:MAC and Regional  Level of Consciousness: awake, alert , and oriented  Airway & Oxygen Therapy: Patient Spontanous Breathing and Patient connected to nasal cannula oxygen  Post-op Assessment: Report given to RN and Post -op Vital signs reviewed and stable  Post vital signs: Reviewed and stable  Last Vitals:  Vitals Value Taken Time  BP 111/52 10/21/24 12:15  Temp 36.6 C 10/21/24 12:15  Pulse 70 10/21/24 12:16  Resp 11 10/21/24 12:16  SpO2 98 % 10/21/24 12:16  Vitals shown include unfiled device data.  Last Pain:  Vitals:   10/21/24 1002  TempSrc:   PainSc: 7       Patients Stated Pain Goal: 4 (10/21/24 1002)  Complications: No notable events documented.

## 2024-10-21 NOTE — Op Note (Signed)
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  The patient did consent to the procedure after discussion of the risks and benefits.  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  PROCEDURE: Right elbow excision of torn ECRB insertion and repair of common extensor tendon.  SURGEON: N. Ozell Fuller, M.D.  ASSIST: None  ANESTHESIA:  MAC, block  IV FLUIDS AND URINE: See anesthesia.  ESTIMATED BLOOD LOSS: minimal mL.  IMPLANTS:  Implant Name Type Inv. Item Serial No. Manufacturer Lot No. LRB No. Used Action  ANCH SUT JUGGERKNOT 2X1.45 BL - [id number] Anchor ANCH SUT JUGGERKNOT 2X1.45 BL  [employer](ORTH,TRAU,BIO,SG) [id number] Right 1 Implanted    DRAINS: None  COMPLICATIONS: see description of procedure.  DESCRIPTION OF PROCEDURE: The patient was brought to the operating room.  The patient had been signed prior to the procedure and this was documented. The patient had the anesthesia placed by the anesthesiologist.  A time-out was performed to confirm that this was the correct patient, site, side and location. The patient did receive antibiotics prior to the incision and was re-dosed during the procedure as needed at indicated intervals.  A tourniquet was placed.  The patient had the operative extremity prepped and draped in the standard surgical fashion.    An incision was made over the anterior border of the radiocapitellar joint and lateral epicondyle.  Gross bleeders were cauterized.  Dissection was carried down through the subcutaneous tissue onto the fascia.  The interval between the [facility] and ECRL was identified and sharply divided with a 15 blade.  The ECRB insertion was identified deep to the ECRL.  The partial tear was visualized along with classic lateral  epicondylitis findings of angiofibroblastic hyperplastic tissue.  This was all sharply excised with a 15 blade.  The lateral collateral ligament complex was intact.  After complete excision of the pathologic tissue the underlying bone was excoriated with an osteotome and rondure to promote healing.  An arthrotomy was performed and synovectomy.  The chondral surfaces were unremarkable.  A repair of the common extensor tendon was performed with a 1.45 mm all suture anchor with #2 fiber tapes.  This was run through the lateral collateral ligament complex and a locking Krakw fashion and repaired down to the lateral epicondyle and the common extensor tendon in a pants over vest fashion.  A 0 Vicryl was used to supplement the repair.  Surgical site was then thoroughly irrigated and closed with 3-0 Vicryl and a running 3-0 Monocryl.  Sterile dressings were applied.  A long-arm splint with the elbow at 90 degrees was applied.  Patient tolerated the procedure well had no any complications.  POSTOPERATIVE PLAN: Patient will be discharged home.  She will follow-up in 1 week for splint removal and initiation of elbow range of motion.  Will place her in a Velcro wrist splint at that time.  Breanna Ozell Cummins, MD 12:20 PM     "

## 2024-10-21 NOTE — Discharge Instructions (Signed)
 "  Postoperative instructions:  Weightbearing instructions: non weight bearing  Dressing instructions: Keep your dressing and/or splint clean and dry at all times.  It will be removed at your first post-operative appointment.  Your stitches and/or staples will be removed at this visit.  Incision instructions:  Do not soak your incision for 3 weeks after surgery.  If the incision gets wet, pat dry and do not scrub the incision.  Pain control:  You have been given a prescription to be taken as directed for post-operative pain control.  In addition, elevate the operative extremity above the heart at all times to prevent swelling and throbbing pain.  Take over-the-counter Colace, 100mg  by mouth twice a day while taking narcotic pain medications to help prevent constipation.  Follow up appointments: 1) 7 days for wound check. 2) Morna Grave as scheduled.   -------------------------------------------------------------------------------------------------------------  After Surgery Pain Control:  After your surgery, post-surgical discomfort or pain is likely. This discomfort can last several days to a few weeks. At certain times of the day your discomfort may be more intense.  Did you receive a nerve block?  A nerve block can provide pain relief for one hour to two days after your surgery. As long as the nerve block is working, you will experience little or no sensation in the area the surgeon operated on.  As the nerve block wears off, you will begin to experience pain or discomfort. It is very important that you begin taking your prescribed pain medication before the nerve block fully wears off. Treating your pain at the first sign of the block wearing off will ensure your pain is better controlled and more tolerable when full-sensation returns. Do not wait until the pain is intolerable, as the medicine will be less effective. It is better to treat pain in advance than to try and catch up.   General Anesthesia:  If you did not receive a nerve block during your surgery, you will need to start taking your pain medication shortly after your surgery and should continue to do so as prescribed by your surgeon.  Pain Medication:  Most commonly we prescribe Vicodin and Percocet for post-operative pain. Both of these medications contain a combination of acetaminophen  (Tylenol ) and a narcotic to help control pain.   It takes between 30 and 45 minutes before pain medication starts to work. It is important to take your medication before your pain level gets too intense.   Nausea is a common side effect of many pain medications. You will want to eat something before taking your pain medicine to help prevent nausea.   If you are taking a prescription pain medication that contains acetaminophen , we recommend that you do not take additional over the counter acetaminophen  (Tylenol ).  Other pain relieving options:   Using a cold pack to ice the affected area a few times a day (15 to 20 minutes at a time) can help to relieve pain, reduce swelling and bruising.   Elevation of the affected area can also help to reduce pain and swelling.  Per [facility] clinic policy, our goal is ensure optimal postoperative pain control with a multimodal pain management strategy. For all [facility] patients, our goal is to wean post-operative narcotic medications by 6 weeks post-operatively. If this is not possible due to utilization of pain medication prior to surgery, your [facility] doctor will support your acute post-operative pain control for the first 6 weeks postoperatively, with a plan to transition you back to your primary pain  team following that. Maralee will work to ensure a therapist, occupational.  "

## 2024-10-23 ENCOUNTER — Telehealth: Payer: Self-pay

## 2024-10-23 ENCOUNTER — Encounter (HOSPITAL_COMMUNITY): Payer: Self-pay | Admitting: Orthopaedic Surgery

## 2024-10-23 NOTE — Telephone Encounter (Signed)
 Some swelling is normal.  She should keep the hand elevated.  She's welcome to loosen the ace if she wants.

## 2024-10-23 NOTE — Telephone Encounter (Signed)
 Patient advising she has a lot of swelling on her surgery hand. Advising that she thinks the cast maybe too tight?

## 2024-10-23 NOTE — Telephone Encounter (Signed)
 Patient advised or Dr. JERRI, advise and patient advised she will do as suggested and call if any other problems occur. Patient also advised to call Monday if any other questions occur.

## 2024-11-03 ENCOUNTER — Ambulatory Visit: Admitting: Physician Assistant

## 2024-11-03 DIAGNOSIS — M7711 Lateral epicondylitis, right elbow: Secondary | ICD-10-CM

## 2024-11-03 NOTE — Progress Notes (Signed)
" ° °  Post-Op Visit Note   Patient: Breanna Fuller           Date of Birth: 10-12-1976           MRN: [id number] Visit Date: 11/03/2024 PCP: Ernesto Elspeth PARAS, MD   Assessment & Plan:  Chief Complaint:  Chief Complaint  Patient presents with   Right Elbow - Routine Post Op    RIGHT TRIGHT TENNIS ELBOW RELEASE-10-21-24    Visit Diagnoses:  1. Lateral epicondylitis of right elbow     Plan: Patient is a pleasant 49 year old female who comes in today 2 weeks status post right tennis elbow release.  She had a fair amount of pain and swelling the first few days but this has improved.  No longer requiring pain medicine.  Examination of her right elbow reveals a well healed surgical incision without evidence of infection or cellulitis.  Fingers warm well-perfused.  She is neurovascular intact distally.  Today, her incision was covered with Steri-Strips.  We have provided her with a Velcro splint for which she will wear at all times for the next 6 weeks.  She may begin range of motion of the wrist and elbow at this time but no resistance until she is 4 weeks postop.  Have provided her with a hardcopy PT prescription I have also put 1 in the computer for [facility] physical therapy which is behind her place of employment.  She will follow-up with us  in 4 weeks for repeat evaluation.  Call with concerns or questions.  Follow-Up Instructions: Return in about 4 weeks (around 12/01/2024).   Orders:  No orders of the defined types were placed in this encounter.  No orders of the defined types were placed in this encounter.   Imaging: No new imaging  PMFS History: Patient Active Problem List   Diagnosis Date Noted   Lateral epicondylitis, right elbow 10/13/2024   Acute sinusitis 01/21/2018   GI symptoms 06/26/2016   Urticaria 12/21/2015   Other allergic rhinitis 08/09/2015   Past Medical History:  Diagnosis Date   Anxiety    PONV (postoperative nausea and vomiting)     Family History   Problem Relation Age of Onset   Allergic rhinitis Neg Hx    Angioedema Neg Hx    Asthma Neg Hx    Atopy Neg Hx    Eczema Neg Hx    Immunodeficiency Neg Hx    Urticaria Neg Hx    Breast cancer Neg Hx     Past Surgical History:  Procedure Laterality Date   RHINOPLASTY     SINOSCOPY     TENNIS ELBOW RELEASE/NIRSCHEL PROCEDURE Right 10/21/2024   Procedure: TENNIS ELBOW RELEASE/NIRSCHEL PROCEDURE;  Surgeon: Jerri Kay HERO, MD;  Location: [facility] OR;  Service: Orthopedics;  Laterality: Right;  right tennis elbow release   TUBAL LIGATION     VAGINAL HYSTERECTOMY  2020   included bladder sling and rectocele repair   Social History   Occupational History   Not on file  Tobacco Use   Smoking status: Never    Passive exposure: Never   Smokeless tobacco: Never  Vaping Use   Vaping status: Never Used  Substance and Sexual Activity   Alcohol use: No    Comment: rare   Drug use: No   Sexual activity: Not on file     "

## 2024-11-05 ENCOUNTER — Ambulatory Visit (INDEPENDENT_AMBULATORY_CARE_PROVIDER_SITE_OTHER): Admitting: *Deleted

## 2024-11-05 DIAGNOSIS — J309 Allergic rhinitis, unspecified: Secondary | ICD-10-CM

## 2024-11-05 DIAGNOSIS — J302 Other seasonal allergic rhinitis: Secondary | ICD-10-CM

## 2024-11-12 ENCOUNTER — Ambulatory Visit: Admitting: *Deleted

## 2024-11-12 DIAGNOSIS — J302 Other seasonal allergic rhinitis: Secondary | ICD-10-CM | POA: Diagnosis not present

## 2024-11-13 ENCOUNTER — Telehealth: Payer: Self-pay | Admitting: Orthopaedic Surgery

## 2024-11-13 NOTE — Telephone Encounter (Signed)
 Updated [employer] form and faxed with Op note and post op note. Will fax PT notes once received from [facility].

## 2024-11-16 ENCOUNTER — Telehealth: Payer: Self-pay | Admitting: Physician Assistant

## 2024-11-16 NOTE — Telephone Encounter (Signed)
 Received PT notes from [facility]. I faxed to [employer] [phone]

## 2024-11-18 ENCOUNTER — Ambulatory Visit

## 2024-11-18 DIAGNOSIS — J302 Other seasonal allergic rhinitis: Secondary | ICD-10-CM | POA: Diagnosis not present

## 2024-12-01 ENCOUNTER — Encounter: Admitting: Physician Assistant

## 2024-12-02 ENCOUNTER — Telehealth: Payer: Self-pay

## 2024-12-02 ENCOUNTER — Ambulatory Visit: Admitting: Physician Assistant

## 2024-12-02 ENCOUNTER — Encounter: Payer: Self-pay | Admitting: Physician Assistant

## 2024-12-02 DIAGNOSIS — M7711 Lateral epicondylitis, right elbow: Secondary | ICD-10-CM

## 2024-12-02 NOTE — Telephone Encounter (Signed)
 Per Morna, patient would like notes to be sent to disability.

## 2024-12-02 NOTE — Progress Notes (Signed)
 "  Post-Op Visit Note   Patient: Breanna Fuller           Date of Birth: February 25, 1976           MRN: [id number] Visit Date: 12/02/2024 PCP: Ernesto Elspeth PARAS, MD   Assessment & Plan:  Chief Complaint:  Chief Complaint  Patient presents with   Right Elbow - Routine Post Op    RIGHT TRIGHT TENNIS ELBOW RELEASE-10-21-24    Visit Diagnoses:  1. Lateral epicondylitis, right elbow     Plan: Patient is a pleasant 49 year old female who comes in today approximately 6 weeks status post right tennis elbow release.  She is still in a fair amount of pain but overall is doing a little better.  She has been mostly compliant wearing her Velcro wrist splint.  She has been in physical therapy as well.  Examination of the right elbow reveals a fully healed surgical scar.  She does have a small scab to the most proximal aspect that looks like it was probably of previous Vicryl but tried to spit out.  No signs of infection or cellulitis.  She does have tenderness along the radial tunnel.  She is still little limited with flexion and extension of the elbow.  She is neurovascular intact distally.  At this point, would like for her to continue wearing her wrist splint for another 2 weeks.  She will continue with range of motion and may incorporate resistance exercises.  She does not feel she is ready to return to work in 2 weeks so we will provide her with an out of work note for the next 3 weeks.  She will follow-up in 3 weeks with Dr. Jerri for repeat evaluation and possible continuation out of work.  Call with concerns or questions.  Follow-Up Instructions: Return in about 3 weeks (around 12/23/2024) for f/u with xu.   Orders:  No orders of the defined types were placed in this encounter.  No orders of the defined types were placed in this encounter.   Imaging: No results found.  PMFS History: Patient Active Problem List   Diagnosis Date Noted   Lateral epicondylitis, right elbow 10/13/2024   Acute  sinusitis 01/21/2018   GI symptoms 06/26/2016   Urticaria 12/21/2015   Other allergic rhinitis 08/09/2015   Past Medical History:  Diagnosis Date   Anxiety    PONV (postoperative nausea and vomiting)     Family History  Problem Relation Age of Onset   Allergic rhinitis Neg Hx    Angioedema Neg Hx    Asthma Neg Hx    Atopy Neg Hx    Eczema Neg Hx    Immunodeficiency Neg Hx    Urticaria Neg Hx    Breast cancer Neg Hx     Past Surgical History:  Procedure Laterality Date   RHINOPLASTY     SINOSCOPY     TENNIS ELBOW RELEASE/NIRSCHEL PROCEDURE Right 10/21/2024   Procedure: TENNIS ELBOW RELEASE/NIRSCHEL PROCEDURE;  Surgeon: Jerri Kay HERO, MD;  Location: [facility] OR;  Service: Orthopedics;  Laterality: Right;  right tennis elbow release   TUBAL LIGATION     VAGINAL HYSTERECTOMY  2020   included bladder sling and rectocele repair   Social History   Occupational History   Not on file  Tobacco Use   Smoking status: Never    Passive exposure: Never   Smokeless tobacco: Never  Vaping Use   Vaping status: Never Used  Substance and Sexual Activity  Alcohol use: No    Comment: rare   Drug use: No   Sexual activity: Not on file     "

## 2024-12-03 NOTE — Telephone Encounter (Signed)
 [employer] form updated and faxed with 12/02/24 note [phone]

## 2024-12-23 ENCOUNTER — Encounter: Admitting: Orthopaedic Surgery
